# Patient Record
Sex: Female | Born: 1945 | Race: White | Hispanic: No
Health system: Midwestern US, Community
[De-identification: ages and names within clinical notes are randomized; demographics above are authoritative.]

## PROBLEM LIST (undated history)

## (undated) DIAGNOSIS — F410 Panic disorder [episodic paroxysmal anxiety] without agoraphobia: Secondary | ICD-10-CM

## (undated) DIAGNOSIS — Z720 Tobacco use: Secondary | ICD-10-CM

## (undated) DIAGNOSIS — Z1231 Encounter for screening mammogram for malignant neoplasm of breast: Secondary | ICD-10-CM

## (undated) DIAGNOSIS — K209 Esophagitis, unspecified without bleeding: Secondary | ICD-10-CM

## (undated) DIAGNOSIS — I2609 Other pulmonary embolism with acute cor pulmonale: Principal | ICD-10-CM

## (undated) DIAGNOSIS — R059 Cough, unspecified: Secondary | ICD-10-CM

## (undated) DIAGNOSIS — Z8262 Family history of osteoporosis: Secondary | ICD-10-CM

## (undated) DIAGNOSIS — R599 Enlarged lymph nodes, unspecified: Secondary | ICD-10-CM

## (undated) DIAGNOSIS — F411 Generalized anxiety disorder: Secondary | ICD-10-CM

## (undated) DIAGNOSIS — R591 Generalized enlarged lymph nodes: Secondary | ICD-10-CM

## (undated) DIAGNOSIS — I10 Essential (primary) hypertension: Secondary | ICD-10-CM

## (undated) DIAGNOSIS — E782 Mixed hyperlipidemia: Secondary | ICD-10-CM

---

## 2014-04-18 ENCOUNTER — Encounter

## 2014-04-18 ENCOUNTER — Inpatient Hospital Stay: Admit: 2014-04-18 | Primary: Family Medicine

## 2014-04-18 ENCOUNTER — Ambulatory Visit: Admit: 2014-04-18 | Primary: Family Medicine

## 2014-04-18 ENCOUNTER — Encounter: Admit: 2014-04-18 | Primary: Family Medicine

## 2014-04-18 DIAGNOSIS — Z8262 Family history of osteoporosis: Secondary | ICD-10-CM

## 2014-04-18 LAB — CBC WITH AUTO DIFFERENTIAL
Basophils %: 1.5 % — ABNORMAL HIGH (ref 0–1)
Basophils Absolute: 0.1 10*3/uL
Eosinophils %: 2.7 % (ref 0–3)
Eosinophils Absolute: 0.2 10*3/uL
Hematocrit: 46.4 % (ref 37–47)
Hemoglobin: 15.3 GM/DL (ref 12.5–16.0)
Lymphocytes %: 45.6 % — ABNORMAL HIGH (ref 24–44)
Lymphocytes Absolute: 2.9 10*3/uL
MCH: 31.5 PG — ABNORMAL HIGH (ref 27–31)
MCHC: 32.9 % (ref 32.0–36.0)
MCV: 95.7 FL (ref 78–100)
MPV: 9.3 FL (ref 7.5–11.1)
Monocytes %: 10 % — ABNORMAL HIGH (ref 0–4)
Monocytes Absolute: 0.6 10*3/uL
Platelets: 286 10*3/uL (ref 140–440)
RBC: 4.85 10*6/uL (ref 4.2–5.4)
RDW: 13.4 % (ref 11.7–14.9)
Segs Absolute: 2.6 10*3/uL
Segs Relative: 40.2 % (ref 36–66)
WBC: 6.4 10*3/uL (ref 4.0–10.5)

## 2014-04-18 LAB — LIPID PANEL
Cholesterol: 269 MG/DL — ABNORMAL HIGH (ref <200)
HDL: 55 MG/DL (ref >40)
LDL Direct: 201 MG/DL — ABNORMAL HIGH (ref <100)
Triglycerides: 104 MG/DL (ref <150)

## 2014-04-18 LAB — COMPREHENSIVE METABOLIC PANEL
ALT: 23 U/L (ref 10–40)
AST: 20 IU/L (ref 15–37)
Albumin: 4.3 GM/DL (ref 3.4–5.0)
Alkaline Phosphatase: 78 IU/L (ref 40–128)
Anion Gap: 12 (ref 4–16)
BUN: 15 MG/DL (ref 6–23)
CO2: 26 MMOL/L (ref 21–32)
Calcium: 9.5 MG/DL (ref 8.3–10.6)
Chloride: 96 mMol/L — ABNORMAL LOW (ref 99–110)
Creatinine: 0.8 MG/DL (ref 0.6–1.1)
GFR African American: >60 mL/min/{1.73_m2}
GFR Non-African American: >60 mL/min/{1.73_m2}
Glucose, Fasting: 100 MG/DL — ABNORMAL HIGH (ref 70–99)
Potassium: 4.5 MMOL/L (ref 3.5–5.1)
Sodium: 134 MMOL/L — ABNORMAL LOW (ref 135–145)
Total Bilirubin: 0.5 MG/DL (ref 0.0–1.0)
Total Protein: 7.2 GM/DL (ref 6.4–8.2)

## 2014-04-18 LAB — TSH: TSH, High Sensitivity: 0.09 u[IU]/mL — ABNORMAL LOW (ref 0.270–4.20)

## 2014-04-18 LAB — T4, FREE: T4 Free: 1.64 NG/DL (ref 0.9–1.8)

## 2015-02-24 ENCOUNTER — Encounter: Attending: Physical Medicine & Rehabilitation | Primary: Family Medicine

## 2015-02-28 ENCOUNTER — Inpatient Hospital Stay: Admit: 2015-02-28 | Discharge: 2015-02-28

## 2015-02-28 ENCOUNTER — Emergency Department: Admit: 2015-02-28 | Primary: Family Medicine

## 2015-02-28 DIAGNOSIS — S92352B Displaced fracture of fifth metatarsal bone, left foot, initial encounter for open fracture: Secondary | ICD-10-CM

## 2015-02-28 MED ORDER — ONDANSETRON 4 MG PO TBDP
4 MG | Freq: Once | ORAL | Status: DC
Start: 2015-02-28 — End: 2015-02-28

## 2015-02-28 MED ORDER — HYDROCODONE-ACETAMINOPHEN 5-325 MG PO TABS
5-325 MG | ORAL_TABLET | Freq: Four times a day (QID) | ORAL | 0 refills | Status: AC | PRN
Start: 2015-02-28 — End: 2015-03-07

## 2015-02-28 MED ORDER — IBUPROFEN 800 MG PO TABS
800 MG | Freq: Once | ORAL | Status: DC
Start: 2015-02-28 — End: 2015-02-28

## 2015-02-28 MED ORDER — ACETAMINOPHEN 500 MG PO TABS
500 MG | Freq: Once | ORAL | Status: DC
Start: 2015-02-28 — End: 2015-02-28

## 2015-02-28 MED FILL — ONDANSETRON 4 MG PO TBDP: 4 MG | ORAL | Qty: 1

## 2015-02-28 MED FILL — IBUPROFEN 800 MG PO TABS: 800 MG | ORAL | Qty: 1

## 2015-02-28 NOTE — ED Provider Notes (Signed)
eMERGENCY dEPARTMENT eNCOUnter        CHIEF COMPLAINT    Chief Complaint   Patient presents with   ??? Foot Injury     tripped and fell injuring R foot and ankle       HPI    Tiffany Little is a 69 y.o. female who presents with right ankle roll walking on a sidewalk about an hour. She denies any ankle pain but states that her foot hurts on the lateral side proximal middle. Onset was about 1300.  The duration has been since the onset.  The quality of the pain is sharp, rates at 6/10 with pressure, less painful at rest. No numbness or tingling to her foot.    REVIEW OF SYSTEMS    General: No fever  Neuro: see HPI    PAST MEDICAL & SURGICAL HISTORY    Past Medical History   Diagnosis Date   ??? Hyperlipidemia    ??? Hypertension    ??? Thyroid disease      History reviewed. No pertinent past surgical history.    CURRENT MEDICATIONS    Current Outpatient Rx   Medication Sig Dispense Refill   ??? HYDROcodone-acetaminophen (NORCO) 5-325 MG per tablet Take 1 tablet by mouth every 6 hours as needed for Pain . 10 tablet 0       ALLERGIES    Allergies   Allergen Reactions   ??? Morphine Nausea And Vomiting       SOCIAL & FAMILY HISTORY    Social History     Social History   ??? Marital status: Married     Spouse name: N/A   ??? Number of children: N/A   ??? Years of education: N/A     Social History Main Topics   ??? Smoking status: Former Smoker   ??? Smokeless tobacco: None   ??? Alcohol use No   ??? Drug use: No   ??? Sexual activity: Not Asked     Other Topics Concern   ??? None     Social History Narrative   ??? None     History reviewed. No pertinent family history.      PHYSICAL EXAM    VITAL SIGNS:   Visit Vitals   ??? BP (!) 149/68   ??? Pulse 65   ??? Temp 97.9 ??F (36.6 ??C) (Oral)   ??? Resp 15   ??? Ht 5\' 5"  (1.651 m)   ??? Wt 225 lb (102.1 kg)   ??? BMI 37.44 kg/m2     Constitutional:  Well developed, well nourished, no acute distress, non-toxic appearance   Vascular: 2+ DP pulses equal bil, < 2 sec cap refill BLE  Musculoskeletal:  She has FROM of her right  foot at ankle, + toe wiggle right, she has ttp over the base of her 5th metatarsal with associated dorsal moderate swelling and purple-grey bruising there  Integument:  Well hydrated, no rash   Neurologic:  Awake alert, no slurred speech, light touch sensation intact BLE  Psychiatric: Cooperative, pleasant affect    RADIOLOGY/PROCEDURES    X-ray right foot:   Impression: ??    ?? Acute nondisplaced transverse traumatic fracture of the base of the 5th  metatarsal.   ??   ?? Narrative: ??   ?? EXAMINATION:  3 VIEWS OF THE LEFT FOOT    02/28/2015 2:30 pm    COMPARISON:  None.    HISTORY:  ORDERING SYSTEM PROVIDED HISTORY: trauma; attention to fifth proximal  metatarsal  TECHNOLOGIST PROVIDED HISTORY:  Ordering Physician Provided Reason for Exam: pain  Acuity: Unknown  Type of Exam: Unknown  Relevant Medical/Surgical History: fall, pain mainly around 5th metacarpal    FINDINGS:  There is an acute nondisplaced transverse fracture of the base of the 5th  metatarsal. ??Mild to moderate degenerative changes of 1st MTP joint. ??No  other acute fracture dislocation. ??Joint spaces alignment otherwise  maintained. ??There is mild soft tissue swelling along the lateral aspect.  Calcaneal spurring. ??Soft tissues are otherwise unremarkable.        X-ray right ankle:    Impression: ??   ?? Fracture of the proximal 5th metatarsal will be better described on the foot  radiographs. ??No acute ankle abnormality   ??   ?? Narrative: ??   ?? EXAMINATION:  3 VIEWS OF THE LEFT ANKLE    02/28/2015 2:30 pm    COMPARISON:  None.    HISTORY:  ORDERING SYSTEM PROVIDED HISTORY: Fall, pain  TECHNOLOGIST PROVIDED HISTORY:  Ordering Physician Provided Reason for Exam: fall  Acuity: Unknown  Type of Exam: Unknown  Relevant Medical/Surgical History: fall, pain especially around 5th metatarsal    FINDINGS:  Anatomic alignment. ??There is a fracture of the proximal 5th metatarsal. ??No  other fractures are seen. ??Enthesopathy at the Achilles insertion and along  the  plantar surface of the calcaneus          ED COURSE & MEDICAL DECISION MAKING    Pertinent Labs & Imaging studies reviewed and interpreted. (See chart for details)    I independently eval this pt.    SPLINT PLACEMENT  A short leg splint was applied to the right foot and leg by the emergency department technician/RN.  I evaluated the splint after it was applied.  The splint is in good position.  The patient's extremity is neurovascularly intact after the splint placement.    I estimate there is LOW risk for COMPARTMENT SYNDROME, DEEP VENOUS THROMBOSIS, COMPLETE TENDON RUPTURE, OR NEUROVASCULAR INJURY, thus I consider the discharge disposition reasonable. Tiffany Little (or their surrogate) and I have discussed the diagnosis and risks, and we agree with discharging home with close follow-up. We also discussed returning to the Emergency Department immediately if new or worsening symptoms occur. We have discussed the symptoms which are most concerning that necessitate immediate return.      Vitals:    02/28/15 1401   BP: (!) 149/68   Pulse: 65   Resp: 15   Temp: 97.9 ??F (36.6 ??C)   TempSrc: Oral   Weight: 225 lb (102.1 kg)   Height:  (1.651 m)       CLINICAL IMPRESSION    Right proximal nondisplaced 5th metatarsal fracture    Plan  New Prescriptions    HYDROCODONE-ACETAMINOPHEN (NORCO) 5-325 MG PER TABLET    Take 1 tablet by mouth every 6 hours as needed for Pain .   Motrin/Tylenol q 6 hours prn pain  F/u with ortho in a week  D/c home      (Please note that this note was completed with a voice recognition program.  Every attempt was made to edit the dictations, but inevitably there remain words that are mis-transcribed.)       Tiffany Little, [place]  02/28/15 1513

## 2015-02-28 NOTE — ED Notes (Signed)
Xray complete

## 2015-02-28 NOTE — ED Provider Notes (Signed)
As physician-in-triage, I performed a medical screening history and physical exam on this patient.    HISTORY OF PRESENT ILLNESS  Tiffany Little is a 69 y.o. female twisted her left foot walking up the sidewalk about an hour ago.      PHYSICAL EXAM  There were no vitals taken for this visit.    On exam, the patient appears well-hydrated, well-nourished, and in no acute distress. Mucous membranes are moist. Speech is clear. Breathing is unlabored.  Skin is dry. Mental status is normal. Moves all extremities, and is without facial droop.     Comment: Please note this report has been produced using speech recognition software and may contain errors related to that system including errors in grammar, punctuation, and spelling, as well as words and phrases that may be inappropriate. If there are any questions or concerns please feel free to contact the dictating provider for clarification.     Mertha Findersohn Tysen Kameela Leipold, MD  02/28/15 1401

## 2015-02-28 NOTE — ED Triage Notes (Signed)
Pt tripped on sidewalk injuring R foot, and ankle.

## 2015-07-05 ENCOUNTER — Emergency Department: Admit: 2015-07-05 | Primary: Family Medicine

## 2015-07-05 ENCOUNTER — Emergency Department: Primary: Family Medicine

## 2015-07-05 ENCOUNTER — Encounter: Admit: 2015-07-05 | Primary: Family Medicine

## 2015-07-05 ENCOUNTER — Inpatient Hospital Stay
Admit: 2015-07-05 | Discharge: 2015-07-08 | Disposition: A | Discharge status: Skilled Nursing Facility | Source: Home / Self Care | Admitting: Internal Medicine

## 2015-07-05 ENCOUNTER — Inpatient Hospital Stay: Admit: 2015-07-05 | Primary: Family Medicine

## 2015-07-05 DIAGNOSIS — J930 Spontaneous tension pneumothorax: Secondary | ICD-10-CM

## 2015-07-05 LAB — CBC WITH AUTO DIFFERENTIAL
Basophils %: 1.4 % — ABNORMAL HIGH (ref 0–1)
Basophils Absolute: 0.1 10*3/uL
Eosinophils %: 3.3 % — ABNORMAL HIGH (ref 0–3)
Eosinophils Absolute: 0.3 10*3/uL
Hematocrit: 40.9 % (ref 37–47)
Hemoglobin: 13.5 GM/DL (ref 12.5–16.0)
Immature Neutrophil %: 0.3 % (ref 0–0.43)
Lymphocytes %: 39.8 % (ref 24–44)
Lymphocytes Absolute: 3.1 10*3/uL
MCH: 32 PG — ABNORMAL HIGH (ref 27–31)
MCHC: 33 % (ref 32.0–36.0)
MCV: 96.9 FL (ref 78–100)
MPV: 9.8 FL (ref 7.5–11.1)
Monocytes %: 9.9 % — ABNORMAL HIGH (ref 0–4)
Monocytes Absolute: 0.8 10*3/uL
Nucleated RBC %: 0 %
Platelets: 287 10*3/uL (ref 140–440)
RBC: 4.22 10*6/uL (ref 4.2–5.4)
RDW: 12.9 % (ref 11.7–14.9)
Segs Absolute: 3.5 10*3/uL
Segs Relative: 45.3 % (ref 36–66)
Total Immature Neutrophil: 0.02 10*3/uL
Total Nucleated RBC: 0 10*3/uL
WBC: 7.7 10*3/uL (ref 4.0–10.5)

## 2015-07-05 LAB — COMPREHENSIVE METABOLIC PANEL
ALT: 26 U/L (ref 10–40)
AST: 29 IU/L (ref 15–37)
Albumin: 3.7 GM/DL (ref 3.4–5.0)
Alkaline Phosphatase: 73 IU/L (ref 40–129)
Anion Gap: 15 (ref 4–16)
BUN: 14 MG/DL (ref 6–23)
CO2: 25 MMOL/L (ref 21–32)
Calcium: 8.7 MG/DL (ref 8.3–10.6)
Chloride: 95 mMol/L — ABNORMAL LOW (ref 99–110)
Creatinine: 0.7 MG/DL (ref 0.6–1.1)
GFR African American: >60 mL/min/{1.73_m2} (ref >60)
GFR Non-African American: >60 mL/min/{1.73_m2} (ref >60)
Glucose: 147 MG/DL — ABNORMAL HIGH (ref 70–140)
Potassium: 3.2 MMOL/L — ABNORMAL LOW (ref 3.5–5.1)
Sodium: 135 MMOL/L (ref 135–145)
Total Bilirubin: 0.5 MG/DL (ref 0.0–1.0)
Total Protein: 6.8 GM/DL (ref 6.4–8.2)

## 2015-07-05 LAB — APTT: aPTT: 26.5 SECONDS (ref 21.2–33.0)

## 2015-07-05 LAB — BRAIN NATRIURETIC PEPTIDE: Pro-BNP: 29.8 PG/ML (ref <300)

## 2015-07-05 LAB — PROTIME-INR
INR: 0.98 INDEX
Protime: 11.2 SECONDS

## 2015-07-05 LAB — TROPONIN
Troponin T: <0.01 NG/ML (ref <0.01)
Troponin T: <0.01 NG/ML (ref <0.01)

## 2015-07-05 MED ORDER — NORMAL SALINE FLUSH 0.9 % IV SOLN
0.9 % | Freq: Once | INTRAVENOUS | Status: AC | PRN
Start: 2015-07-05 — End: 2015-07-05
  Administered 2015-07-05: 18:00:00 10 mL via INTRAVENOUS

## 2015-07-05 MED ORDER — NORMAL SALINE FLUSH 0.9 % IV SOLN
0.9 % | INTRAVENOUS | Status: DC | PRN
Start: 2015-07-05 — End: 2015-07-06

## 2015-07-05 MED ORDER — HYDROCODONE-ACETAMINOPHEN 5-325 MG PO TABS
5-325 MG | Freq: Once | ORAL | Status: AC
Start: 2015-07-05 — End: 2015-07-05
  Administered 2015-07-05: 16:00:00 2 via ORAL

## 2015-07-05 MED ORDER — ACETAMINOPHEN 325 MG PO TABS
325 MG | ORAL | Status: DC | PRN
Start: 2015-07-05 — End: 2015-07-05

## 2015-07-05 MED ORDER — LEVOTHYROXINE SODIUM 100 MCG PO TABS
100 MCG | Freq: Every day | ORAL | Status: DC
Start: 2015-07-05 — End: 2015-07-06

## 2015-07-05 MED ORDER — HYDROMORPHONE HCL 1 MG/ML IJ SOLN
1 MG/ML | INTRAMUSCULAR | Status: DC | PRN
Start: 2015-07-05 — End: 2015-07-06
  Administered 2015-07-06 (×2): 0.5 mg via INTRAVENOUS

## 2015-07-05 MED ORDER — ENOXAPARIN SODIUM 40 MG/0.4ML SC SOLN
40 MG/0.4ML | Freq: Every day | SUBCUTANEOUS | Status: DC
Start: 2015-07-05 — End: 2015-07-06

## 2015-07-05 MED ORDER — MORPHINE SULFATE (PF) 2 MG/ML IV SOLN
2 MG/ML | INTRAVENOUS | Status: DC | PRN
Start: 2015-07-05 — End: 2015-07-05

## 2015-07-05 MED ORDER — NORMAL SALINE FLUSH 0.9 % IV SOLN
0.9 | INTRAVENOUS | Status: AC
Start: 2015-07-05 — End: 2015-07-05

## 2015-07-05 MED ORDER — PANTOPRAZOLE SODIUM 40 MG PO TBEC
40 MG | Freq: Every day | ORAL | Status: DC
Start: 2015-07-05 — End: 2015-07-06

## 2015-07-05 MED ORDER — POTASSIUM CHLORIDE 10 MEQ/100ML IV SOLN
10 MEQ/0ML | INTRAVENOUS | Status: DC | PRN
Start: 2015-07-05 — End: 2015-07-06

## 2015-07-05 MED ORDER — NORMAL SALINE FLUSH 0.9 % IV SOLN
0.9 % | Freq: Two times a day (BID) | INTRAVENOUS | Status: DC
Start: 2015-07-05 — End: 2015-07-06
  Administered 2015-07-06 (×2): 10 mL via INTRAVENOUS

## 2015-07-05 MED ORDER — POTASSIUM CHLORIDE CRYS ER 20 MEQ PO TBCR
20 MEQ | ORAL | Status: DC | PRN
Start: 2015-07-05 — End: 2015-07-06

## 2015-07-05 MED ORDER — TRIAMTERENE-HCTZ 37.5-25 MG PO TABS
Freq: Every morning | ORAL | Status: DC
Start: 2015-07-05 — End: 2015-07-06

## 2015-07-05 MED ORDER — OXYCODONE-ACETAMINOPHEN 5-325 MG PO TABS
5-325 MG | ORAL | Status: DC | PRN
Start: 2015-07-05 — End: 2015-07-06
  Administered 2015-07-06: 04:00:00 1 via ORAL

## 2015-07-05 MED ORDER — IOVERSOL 68 % IV SOLN
68 | INTRAVENOUS | Status: AC
Start: 2015-07-05 — End: 2015-07-05

## 2015-07-05 MED ORDER — LIDOCAINE-EPINEPHRINE 1 %-1:100000 IJ SOLN
1 %-:00000 | Freq: Once | INTRAMUSCULAR | Status: AC
Start: 2015-07-05 — End: 2015-07-05
  Administered 2015-07-05: 17:00:00 20 mL via INTRADERMAL

## 2015-07-05 MED ORDER — ATENOLOL 25 MG PO TABS
25 MG | Freq: Every day | ORAL | Status: DC
Start: 2015-07-05 — End: 2015-07-06

## 2015-07-05 MED ORDER — FENTANYL CITRATE (PF) 100 MCG/2ML IJ SOLN
100 MCG/2ML | Freq: Once | INTRAMUSCULAR | Status: DC
Start: 2015-07-05 — End: 2015-07-05

## 2015-07-05 MED ORDER — IOVERSOL 68 % IV SOLN
68 % | Freq: Once | INTRAVENOUS | Status: AC | PRN
Start: 2015-07-05 — End: 2015-07-05
  Administered 2015-07-05: 18:00:00 100 mL via INTRAVENOUS

## 2015-07-05 MED ORDER — MIDAZOLAM HCL 2 MG/2ML IJ SOLN
2 MG/ML | Freq: Once | INTRAMUSCULAR | Status: AC
Start: 2015-07-05 — End: 2015-07-05
  Administered 2015-07-05: 17:00:00 2 mg via INTRAVENOUS

## 2015-07-05 MED ORDER — DOCUSATE SODIUM 100 MG PO CAPS
100 MG | Freq: Two times a day (BID) | ORAL | Status: DC
Start: 2015-07-05 — End: 2015-07-06
  Administered 2015-07-06: 01:00:00 100 mg via ORAL

## 2015-07-05 MED ORDER — HYDROCODONE-ACETAMINOPHEN 5-325 MG PO TABS
5-325 MG | Freq: Once | ORAL | Status: AC
Start: 2015-07-05 — End: 2015-07-05
  Administered 2015-07-05: 17:00:00 1 via ORAL

## 2015-07-05 MED ORDER — VERAPAMIL HCL 80 MG PO TABS
80 MG | Freq: Three times a day (TID) | ORAL | Status: DC
Start: 2015-07-05 — End: 2015-07-06
  Administered 2015-07-06: 01:00:00 80 mg via ORAL

## 2015-07-05 MED ORDER — POTASSIUM CHLORIDE 20 MEQ/15ML (10%) PO SOLN
20 MEQ/15ML (10%) | ORAL | Status: DC | PRN
Start: 2015-07-05 — End: 2015-07-06

## 2015-07-05 MED ORDER — SIMVASTATIN 20 MG PO TABS
20 MG | Freq: Every evening | ORAL | Status: DC
Start: 2015-07-05 — End: 2015-07-06
  Administered 2015-07-06: 01:00:00 20 mg via ORAL

## 2015-07-05 MED ORDER — MAGNESIUM SULFATE IN D5W 1-5 GM/100ML-% IV SOLN
1-5 GM/100ML-% | INTRAVENOUS | Status: DC | PRN
Start: 2015-07-05 — End: 2015-07-06

## 2015-07-05 MED ORDER — MIDAZOLAM HCL 2 MG/2ML IJ SOLN
2 MG/ML | Freq: Once | INTRAMUSCULAR | Status: AC | PRN
Start: 2015-07-05 — End: 2015-07-05
  Administered 2015-07-05: 16:00:00 2 mg via INTRAVENOUS

## 2015-07-05 MED ORDER — ONDANSETRON HCL 4 MG/2ML IJ SOLN
4 MG/2ML | Freq: Four times a day (QID) | INTRAMUSCULAR | Status: DC | PRN
Start: 2015-07-05 — End: 2015-07-05

## 2015-07-05 MED ORDER — NITROGLYCERIN 0.4 MG SL SUBL
0.4 MG | SUBLINGUAL | Status: AC | PRN
Start: 2015-07-05 — End: 2015-07-05
  Administered 2015-07-05 (×3): 0.4 mg via SUBLINGUAL

## 2015-07-05 MED ORDER — SODIUM CHLORIDE 0.9 % IV SOLN
0.9 % | INTRAVENOUS | Status: DC
Start: 2015-07-05 — End: 2015-07-06

## 2015-07-05 MED ORDER — ACETAMINOPHEN 325 MG PO TABS
325 MG | ORAL | Status: DC | PRN
Start: 2015-07-05 — End: 2015-07-06

## 2015-07-05 MED ORDER — SODIUM CHLORIDE 0.9 % IV SOLN
0.9 % | INTRAVENOUS | Status: DC
Start: 2015-07-05 — End: 2015-07-06
  Administered 2015-07-05 – 2015-07-06 (×2): via INTRAVENOUS

## 2015-07-05 MED ORDER — MAGNESIUM HYDROXIDE 400 MG/5ML PO SUSP
400 MG/5ML | Freq: Every day | ORAL | Status: DC | PRN
Start: 2015-07-05 — End: 2015-07-05

## 2015-07-05 MED ORDER — ONDANSETRON HCL 4 MG/2ML IJ SOLN
4 MG/2ML | Freq: Four times a day (QID) | INTRAMUSCULAR | Status: DC | PRN
Start: 2015-07-05 — End: 2015-07-06
  Administered 2015-07-06: 02:00:00 4 mg via INTRAVENOUS

## 2015-07-05 MED ORDER — MAGNESIUM HYDROXIDE 400 MG/5ML PO SUSP
400 MG/5ML | Freq: Every day | ORAL | Status: DC | PRN
Start: 2015-07-05 — End: 2015-07-06

## 2015-07-05 MED ORDER — KETOROLAC TROMETHAMINE 30 MG/ML IJ SOLN
30 MG/ML | Freq: Four times a day (QID) | INTRAMUSCULAR | Status: DC | PRN
Start: 2015-07-05 — End: 2015-07-05

## 2015-07-05 MED FILL — NORMAL SALINE FLUSH 0.9 % IV SOLN: 0.9 % | INTRAVENOUS | Qty: 20

## 2015-07-05 MED FILL — SODIUM CHLORIDE 0.9 % IV SOLN: 0.9 % | INTRAVENOUS | Qty: 1000

## 2015-07-05 MED FILL — HYDROCODONE-ACETAMINOPHEN 5-325 MG PO TABS: 5-325 MG | ORAL | Qty: 1

## 2015-07-05 MED FILL — OPTIRAY 320 68 % IV SOLN: 68 % | INTRAVENOUS | Qty: 100

## 2015-07-05 MED FILL — HYDROCODONE-ACETAMINOPHEN 5-325 MG PO TABS: 5-325 MG | ORAL | Qty: 2

## 2015-07-05 MED FILL — LIDOCAINE-EPINEPHRINE 1 %-1:100000 IJ SOLN: 1 %-:00000 | INTRAMUSCULAR | Qty: 20

## 2015-07-05 MED FILL — VERAPAMIL HCL 80 MG PO TABS: 80 MG | ORAL | Qty: 1

## 2015-07-05 MED FILL — MIDAZOLAM HCL 2 MG/2ML IJ SOLN: 2 MG/ML | INTRAMUSCULAR | Qty: 2

## 2015-07-05 MED FILL — NITROSTAT 0.4 MG SL SUBL: 0.4 MG | SUBLINGUAL | Qty: 25

## 2015-07-05 NOTE — Progress Notes (Signed)
Dr. Aquilla Solian returned call, informed of consult and patient's condition.  Advised to have a bedside: line cart, 6-61/2 sterile gloves, mask, gown, 10/12 french chest tube and chest tube kit.  Will be up to see patient.  Maggie Schwalbe, RN

## 2015-07-05 NOTE — Progress Notes (Signed)
No dressing noted on chest tube.  Medicated vaseline guaze applied with 3 split guaze and covered with 5 4x4's.  occlusively taped with 2 inch silk tape.  Stoney BangKim Flax, RN Team Leader notified and at bedside.  Patient tol well.  Assisted into bed from sitting on side of bed.  Right chest tube remains at -20 cm suction with no fluctuation in water chamber.  Air leak noted.  Patient complains of shortness of breath. Resp even at 20.  SaO2 98% on 2 lpm by nasal cannula.  Stat PCXR ordered.  Rodell PernaPamela J Vanice Rappa, RN

## 2015-07-05 NOTE — ED Notes (Signed)
Patient back from CT.      Ross MarcusAmber McDevitt, RN  07/05/15 [phone]

## 2015-07-05 NOTE — Progress Notes (Signed)
Dr. Ward GivensKhouzam paged by dialing [phone] and speaking with bureau and sending a page.  Rodell PernaPamela J Jeovanny Cuadros, RN

## 2015-07-05 NOTE — ED Notes (Signed)
Patient is going upstairs to room 2015 at this time.     Ross MarcusAmber McDevitt, RN  07/05/15 [phone]

## 2015-07-05 NOTE — ED Provider Notes (Signed)
eMERGENCY dEPARTMENT eNCOUnter      PCP: Aggie Moats, MD    CHIEF COMPLAINT    Chief Complaint   Patient presents with   ??? Chest Pain   ??? Shortness of Breath       HPI    Tiffany Little is a 70 y.o. female who presents to the emergency department today with a chief complaint of right-sided chest pain and shortness of breath.  Onset was approximately one hour prior to arrival.  Patient states that she was doing yard work when she began experiencing this pressure and chest pain to the right anterior chest wall area.  Denies recent trauma.  Patient stated the pain radiates from the right chest wall up into the right neck area.  Has been mildly short of breath.  Has had a mild cough over the last several days.  Patient presented to the emergency department today with hypoxia.  She was 87% initially, she was put on 2 L and got into the 90s.  She has not been seen by cardiology in several years.  Has low risk factors.  The family history of DVT.  No exogenous estrogen, no recent leg swelling.  No hemoptysis.  Does have pain with deep inspiration.    REVIEW OF SYSTEMS    Constitutional:  Denies fever, chills, weight loss or weakness   HENT:  Denies sore throat or ear pain   Cardiovascular: + chest pain.  No palpitations, No syncope  Respiratory:  See HPI.   GI:  Denies abdominal pain, nausea, vomiting, or diarrhea  GU:  Denies any urinary symptoms or vaginal symptoms.   Musculoskeletal:  Denies back pain,   Skin:  Denies rash  Neurologic:  Denies headache, focal weakness or sensory changes   Endocrine:  Denies polyuria or polydypsia   Lymphatic:  Denies swollen glands     All other review of systems are negative  See HPI and nursing notes for additional information       PAST MEDICAL & SURGICAL HISTORY    Past Medical History:   Diagnosis Date   ??? Hyperlipidemia    ??? Hypertension    ??? Thyroid disease      History reviewed. No pertinent surgical history.    CURRENT MEDICATIONS    Current Outpatient Rx   Medication Sig  Dispense Refill   ??? verapamil (CALAN) 80 MG tablet Take 80 mg by mouth 3 times daily     ??? DULoxetine (CYMBALTA) 30 MG extended release capsule Take 30 mg by mouth daily     ??? simvastatin (ZOCOR) 40 MG tablet Take 40 mg by mouth nightly     ??? atenolol (TENORMIN) 25 MG tablet Take 25 mg by mouth daily         ALLERGIES    Allergies   Allergen Reactions   ??? Morphine Nausea And Vomiting       SOCIAL & FAMILY HISTORY    Social History     Social History   ??? Marital status: Married     Spouse name: N/A   ??? Number of children: N/A   ??? Years of education: N/A     Social History Main Topics   ??? Smoking status: Former Smoker   ??? Smokeless tobacco: None   ??? Alcohol use No   ??? Drug use: No   ??? Sexual activity: Not Asked     Other Topics Concern   ??? None     Social History Narrative  History reviewed. No pertinent family history.    PHYSICAL EXAM    VITAL SIGNS: BP 128/78   Pulse 69   Resp 16   SpO2 96%   Constitutional:  Well developed, well nourished, no acute distress   HENT:  Atraumatic, moist mucus membranes  Neck/Lymphatics: supple, no JVD, no swollen nodes  Respiratory:  Nonlabored breathing.  Lung sounds are diminished bilaterally.  No obvious rhonchi or rales.  No adventitious sounds.  No accessory muscle use.  Patient is talking in full sentences.   Cardiovascular:  regular rate, no murmurs  GI:  Soft, nontender, normal bowel sounds  Musculoskeletal:   No edema, no acute deformities  Integument:  Skin is warm and dry, no petechiae   Neurologic:  Alert & oriented, no slurred speech  Psych: Pleasant affect, no hallucinations    EKG    See supervising physician's note for EKG interpretation    LABS:  Results for orders placed or performed during the hospital encounter of 07/05/15   CBC auto diff   Result Value Ref Range    WBC 7.7 4.0 - 10.5 K/CU MM    RBC 4.22 4.2 - 5.4 M/CU MM    Hemoglobin 13.5 12.5 - 16.0 GM/DL    Hematocrit 16.1 37 - 47 %    MCV 96.9 78 - 100 FL    MCH 32.0 (H) 27 - 31 PG    MCHC 33.0 32.0 - 36.0  %    RDW 12.9 11.7 - 14.9 %    Platelets 287 140 - 440 K/CU MM    MPV 9.8 7.5 - 11.1 FL    Differential Type AUTOMATED DIFFERENTIAL     Segs Relative 45.3 36 - 66 %    Lymphocytes % 39.8 24 - 44 %    Monocytes % 9.9 (H) 0 - 4 %    Eosinophils % 3.3 (H) 0 - 3 %    Basophils % 1.4 (H) 0 - 1 %    Segs Absolute 3.5 K/CU MM    Lymphocytes # 3.1 K/CU MM    Monocytes # 0.8 K/CU MM    Eosinophils # 0.3 K/CU MM    Basophils # 0.1 K/CU MM    Nucleated RBC % 0.0 %    Total Nucleated RBC 0.0 K/CU MM    Total Immature Neutrophil 0.02 K/CU MM    Immature Neutrophil % 0.3 0 - 0.43 %   PT - INR   Result Value Ref Range    Protime 11.2 SECONDS    INR 0.98 INDEX   PTT   Result Value Ref Range    aPTT 26.5 21.2 - 33.0 SECONDS   CMP   Result Value Ref Range    Sodium 135 135 - 145 MMOL/L    Potassium 3.2 (L) 3.5 - 5.1 MMOL/L    Chloride 95 (L) 99 - 110 mMol/L    CO2 25 21 - 32 MMOL/L    BUN 14 6 - 23 MG/DL    CREATININE 0.7 0.6 - 1.1 MG/DL    Glucose 096 (H) 70 - 140 MG/DL    Calcium 8.7 8.3 - 04.5 MG/DL    Alb 3.7 3.4 - 5.0 GM/DL    Total Protein 6.8 6.4 - 8.2 GM/DL    Total Bilirubin 0.5 0.0 - 1.0 MG/DL    ALT 26 10 - 40 U/L    AST 29 15 - 37 IU/L    Alkaline Phosphatase 73 40 - 129 IU/L  GFR Non-African American >60 >60 mL/min/1.6573m2    GFR African American >60 >60 mL/min/1.6073m2    Anion Gap 15 4 - 16   Troponin   Result Value Ref Range    Troponin T <0.010 <0.01 NG/ML   Brain Natriuretic Peptide   Result Value Ref Range    Pro-BNP 29.80 <300 PG/ML   EKG 12 Lead   Result Value Ref Range    Ventricular Rate 75 BPM    Atrial Rate 75 BPM    P-R Interval 140 ms    QRS Duration 80 ms    Q-T Interval 420 ms    QTc Calculation (Bazett) 469 ms    P Axis 0 degrees    R Axis -9 degrees    T Axis 29 degrees    Diagnosis       Normal sinus rhythm  Normal ECG  No previous ECGs available       RADIOLOGY/PROCEDURES    ?? XR Chest Portable (Final result) Result time: 07/05/15 14:21:08   ?? Final result by Marcy SalvoMatthew M Robbins, MD (07/05/15 14:21:08)    ?? Impression: ??   ?? Interval right chest tube placement with partial re-expansion of the right  lung. ??Small -moderate right pneumothorax remains.    Side hole of the chest tube projects just beyond the lateral margin of the  chest. ??Consider slightly advancing.   ??   ?? Narrative: ??   ?? EXAMINATION:  SINGLE VIEW OF THE CHEST    07/05/2015 1:36 pm    COMPARISON:  Jul 05, 2015    HISTORY:  ORDERING SYSTEM PROVIDED HISTORY: check tube placement  TECHNOLOGIST PROVIDED HISTORY:  Ordering Physician Provided Reason for Exam: tube placement  Acuity: Unknown  Type of Encounter: Unknown  Additional signs and symptoms: ct placement    FINDINGS:  Right chest tube has been placed. ??Side hole projects just beyond the ribs.  Right lung has been partially re-expanded. ??Small-moderate right pneumothorax  remains.    Left lung is clear.    Mild cardiomegaly. ??No pulmonary edema.   ??   ??    ??    ?? CTA Pulmonary With Contrast (In process)    ??    ?? XR Chest Portable (Final result) Result time: 07/05/15 13:07:24   ?? Final result by Para MarchAndrew C Neckers, MD (07/05/15 13:07:24)   ?? Impression: ??   ?? Moderate to large right pneumothorax with evidence for tension.    Critical results were called by Dr. Doy HutchingAndrew C. Neckers, MD to Joash Tony S  Bobak Oguinn on 07/05/2015 at 12:03.   ??   ?? Narrative: ??   ?? EXAMINATION:  SINGLE VIEW OF THE CHEST    07/05/2015 11:52 am    COMPARISON:  None    HISTORY:  ORDERING SYSTEM PROVIDED HISTORY: Chest pain  TECHNOLOGIST PROVIDED HISTORY:  Ordering Physician Provided Reason for Exam: Chest pain  Acuity: Acute  Type of Encounter: Initial  Additional signs and symptoms: Patient presents to ER today for chest pain  and sob. Patient reports that she was doing work around the house and noticed  some pressure in her mid chest    FINDINGS:  There is a moderate to large right pneumothorax. ??There is leftward  mediastinal shift. ??Airspace opacities to portions of right lung likely  relate to partial collapse relating to the  pneumothorax. ??Left lung appears  clear. ??Cardiac and mediastinal silhouettes are within normal limits. ??No  acute bony abnormalities.  ED COURSE & MEDICAL DECISION MAKING       Vital signs and nursing notes reviewed during ED course.  Patient care and presentation staffed with supervising MD.  Patient seen by supervising MD today- see his/her note for details of the encounter.    All pertinent Lab data and radiographic results reviewed with patient at bedside.  The patient and/or the family were informed of the results of any tests/labs/imaging, the treatment plan, and time was allotted to answer questions.       Vitals:    07/05/15 1034 07/05/15 1232   BP:  128/78   Pulse:  69   Resp:  16   TempSrc: Oral    SpO2:  96%       Differential Diagnosis: Acute Coronary Syndrome, Congestive Heart Failure, Myocardial Infarction, Pulmonary Embolus, Pneumonia, Pneumothorax, other    Clinical  IMPRESSION    1. Tension pneumothorax, spontaneous      Patient presented above.  Patient had right-sided chest pain that radiated up into her neck area with associated shortness of breath and hour prior to arrival.  She was initially hypoxic in the upper 80s, we placed her on oxygen she was up in the mid 90s.  Her vital signs are stable.  She was talking in full sentences.  There is no signs of obvious respiratory distress.  Lung sounds are mis-bilaterally.  EKG demonstrated no signs of ACS.  Chest x-ray did show a moderate to large right pneumothorax with evidence for tension.  Patient was moved into trauma bay.  With Dr. Remigio Eisenmenger assistance we performed a chest tube to the right chest area at the fourth intercostal space.  Patient was given Versed.  There is mild difficulty inserting the chest tube secondary to body habitus.  Tube was eventually placed with a gush of air, lung sounds were equal bilaterally.  Repeat chest x-ray did show interval right chest tube placement with partial reexpansion of the right lung.  There is still  a small right pneumothorax remains.  Side hole the chest tube projects just beyond the lateral margin of the chest, radiology considered slightly advancing.  Went back in the room and slightly advanced the chest tube.  Troponin was negative.  We have a CTA pending.  They called the hospitalist for admission.  They will admit the patient. Patient placed in ICU.  This patient was staffed with Dr. Danae Orleans.  Dr. Selmer Dominion the cardiothoracic surgeon was notified about the patient. See Dr. Remigio Eisenmenger note for further details.    Comment: Please note this report has been produced using speech recognition software and may contain errors related to that system including errors in grammar, punctuation, and spelling, as well as words and phrases that may be inappropriate. If there are any questions or concerns please feel free to contact the dictating provider for clarification.                       Anastasio Auerbach Jae Bruck, PA  07/05/15 1457

## 2015-07-05 NOTE — ED Notes (Signed)
Patient to CT.     Ross MarcusAmber McDevitt, RN  07/05/15 1352

## 2015-07-05 NOTE — Progress Notes (Signed)
Dr. Milta DeitersS. Khan returned call, update on patient's condition given.  No new orders.  Rodell PernaPamela J Harvy Riera, RN

## 2015-07-05 NOTE — ED Notes (Signed)
ICU SD charge nurse Selena BattenKim called stating that they cannot take the patient until the order is in for the patient to go to room 2015. This nurse contacted my charge nurse, Durwin RegesJackie RN, and she is working on it.     Ross MarcusAmber McDevitt, RN  07/05/15 1536

## 2015-07-05 NOTE — ED Notes (Signed)
[facility]     Ross MarcusAmber McDevitt, RN  07/05/15 [phone]

## 2015-07-05 NOTE — ED Notes (Signed)
Matt PA in talking with patient about plan of care.      Ross MarcusAmber McDevitt, RN  07/05/15 [phone]

## 2015-07-05 NOTE — ED Provider Notes (Signed)
APP Supervisory Note    This patient was evaluated in the ED for chest pain, SOB, and hypoxia, although initial H&PE information was obtained by Christiane HaMatthew Brumfield, PA, who also dictated a record of this visit.  I independently examined and evaluated this patient and made all diagnostic, treatment, and disposition decisions.      In brief, Tiffany Little is a 70 y.o. female with a history of HTN, thyroid disease, HLD that presents with right sided chest pain radiating to the back about an hour prior to arrival.  She was doing yardwork.  Had lightheadedness and SOB with associated nausea.  Last stress test in the 80s.  Former smoker.  No history of DVT or risk factors of DVT.  The patient was hypoxic in the 80s on arrival, now on 4L in the 90s.    ED Triage Vitals   Enc Vitals Group      BP --       Pulse --       Resp --       Temp --       Temp Source 07/05/15 1034 Oral      SpO2 --       Weight --       Height --       Head Cir --       Peak Flow --       Pain Score --       Pain Loc --       Pain Edu? --       Excl. in GC? --      BP 128/77   Pulse 88   Temp 98.1 ??F (36.7 ??C) (Oral)    Resp 21   SpO2 97%  Focused physical examination revealed Heart RRR, lungs clear bilaterally - difficult exam 2/2 habitus, abdomen S/NT/ND, no leg swelling    Labs, Imaging, and EKG findings:  Results for orders placed or performed during the hospital encounter of 07/05/15   CBC auto diff   Result Value Ref Range    WBC 7.7 4.0 - 10.5 K/CU MM    RBC 4.22 4.2 - 5.4 M/CU MM    Hemoglobin 13.5 12.5 - 16.0 GM/DL    Hematocrit 16.140.9 37 - 47 %    MCV 96.9 78 - 100 FL    MCH 32.0 (H) 27 - 31 PG    MCHC 33.0 32.0 - 36.0 %    RDW 12.9 11.7 - 14.9 %    Platelets 287 140 - 440 K/CU MM    MPV 9.8 7.5 - 11.1 FL    Differential Type AUTOMATED DIFFERENTIAL     Segs Relative 45.3 36 - 66 %    Lymphocytes % 39.8 24 - 44 %    Monocytes % 9.9 (H) 0 - 4 %    Eosinophils % 3.3 (H) 0 - 3 %    Basophils % 1.4 (H) 0 - 1 %    Segs Absolute 3.5 K/CU MM     Lymphocytes # 3.1 K/CU MM    Monocytes # 0.8 K/CU MM    Eosinophils # 0.3 K/CU MM    Basophils # 0.1 K/CU MM    Nucleated RBC % 0.0 %    Total Nucleated RBC 0.0 K/CU MM    Total Immature Neutrophil 0.02 K/CU MM    Immature Neutrophil % 0.3 0 - 0.43 %   PT - INR   Result Value Ref Range  Protime 11.2 SECONDS    INR 0.98 INDEX   PTT   Result Value Ref Range    aPTT 26.5 21.2 - 33.0 SECONDS   CMP   Result Value Ref Range    Sodium 135 135 - 145 MMOL/L    Potassium 3.2 (L) 3.5 - 5.1 MMOL/L    Chloride 95 (L) 99 - 110 mMol/L    CO2 25 21 - 32 MMOL/L    BUN 14 6 - 23 MG/DL    CREATININE 0.7 0.6 - 1.1 MG/DL    Glucose 161 (H) 70 - 140 MG/DL    Calcium 8.7 8.3 - 09.6 MG/DL    Alb 3.7 3.4 - 5.0 GM/DL    Total Protein 6.8 6.4 - 8.2 GM/DL    Total Bilirubin 0.5 0.0 - 1.0 MG/DL    ALT 26 10 - 40 U/L    AST 29 15 - 37 IU/L    Alkaline Phosphatase 73 40 - 129 IU/L    GFR Non-African American >60 >60 mL/min/1.53m2    GFR African American >60 >60 mL/min/1.76m2    Anion Gap 15 4 - 16   Troponin   Result Value Ref Range    Troponin T <0.010 <0.01 NG/ML   Brain Natriuretic Peptide   Result Value Ref Range    Pro-BNP 29.80 <300 PG/ML   Troponin   Result Value Ref Range    Troponin T <0.010 <0.01 NG/ML   EKG 12 Lead   Result Value Ref Range    Ventricular Rate 75 BPM    Atrial Rate 75 BPM    P-R Interval 140 ms    QRS Duration 80 ms    Q-T Interval 420 ms    QTc Calculation (Bazett) 469 ms    P Axis 0 degrees    R Axis -9 degrees    T Axis 29 degrees    Diagnosis       Normal sinus rhythm  Normal ECG  No previous ECGs available  Confirmed by Nkadi MD, Chukwuemeke ([id number]) on 07/05/2015 4:37:29 PM       XR Chest Portable   Final Result   Decrease in the size of the right pneumothorax.  Otherwise, stable chest         CTA Pulmonary With Contrast   Preliminary Result   1. No pulmonary embolus.   2. Small to moderate right pneumothorax with right chest tube in place.   Extensive subcutaneous gas seen along the right lateral chest wall.    3. Bibasilar atelectasis.   4. Diffuse ground glass throughout the lungs is favored to represent   atelectasis over pulmonary edema.   5. Prominent right peritracheal and hilar lymph nodes, possibly reactive.   Recommend repeat chest CT with IV contrast in three months to evaluate for   resolution.         XR Chest Portable   Final Result   Interval right chest tube placement with partial re-expansion of the right   lung.  Small -moderate right pneumothorax remains.      Side hole of the chest tube projects just beyond the lateral margin of the   chest.  Consider slightly advancing.         XR Chest Portable   Final Result   Moderate to large right pneumothorax with evidence for tension.      Critical results were called by Dr. Doy Hutching. Neckers, MD to MATTHEW S   BRUMFIELD on 07/05/2015 at 12:03.  XR Chest Portable    (Results Pending)   XR Chest Standard TWO VW    (Results Pending)      12 lead EKG per my interpretation:  Normal Sinus Rhythm 75bpm  Axis is   Left axis deviation  QTc is  Prolonged to 469  There is no specific T wave changes appreciated.  There is no specific ST wave changes appreciated.    Prior EKG to compare with was not available.        MDM:  70 y.o. female with a history of HTN, thyroid disease, HLD that presents with right sided chest pain radiating to the back about an hour prior to arrival.  Concerns for ACS, arrhythmia, PE, bronchitis, pneumonia, musculoskeletal pain.  Labs, imaging, and EKG as above.  CBC unremarkable.  Coags WNL.  CMP with mild hypokalemia.  Troponin <0.010.  BNP 29.  CxR with a moderate to large right pneumothorax with evidence for torsion.  This patient had a right-sided chest tube placed in the emergency department by me.  She was given Versed for anxiety in the emergency department.  Her chest tube was placed on suction, and she will need to be admitted for further medical management.      Chest Tube Placement Procedure Note    Indication:  pneumothorax    Consent: The patient was counseled regarding the procedure, its indications, risks, potential complications and alternatives, and any questions were answered. Consent was obtained to proceed.    Pre-Medication: midazolam (Versed) 2.0 mg intravenously    Procedure: The patient was placed in a semirecumbent position with the head of the bed at 30 degrees. The right side was prepped with chlorhexidine.  Local anesthesia over the insertion site was obtained by infiltration using 1% Lidocaine with epinephrine.  An incision was made laterally in the midaxillary line.  Blunt dissection up and over the rib was performed until access was obtained into the pleural cavity.  A 26 French chest tube was placed and connected to suctio.  Initial output from the tube was air. The tube was sutured in place and the site was covered with an occlusive dressing.  All connections were banded.  Breath sounds after the procedure were improved.  A chest x-ray was obtained to evaluate placement which showed tube placement that required repositioning which was subsequently performed.    The patient tolerated the procedure well.    Complications: None          Clinical Impression:  1. Tension pneumothorax, spontaneous    2. Essential hypertension    3. Mixed hyperlipidemia    4. Thyroid disease         For other details of my evaluation of this patient, please see Molli Hazard Brumfield's documentation.     Hyman Bible, MD      Please note that portions of this note may have been complete with a voice recognition program.  Efforts were made to edit the dictations, but occasional words are mis-transcribed.         Hyman Bible, MD  07/05/15 2149

## 2015-07-05 NOTE — ED Notes (Signed)
CT WAITING LAB RESULTS.

## 2015-07-05 NOTE — ED Notes (Signed)
Matt PA and Dr. Danae OrleansBush at bedside putting in chest tube at this time     Ross Marcusmber McDevitt, RN  07/05/15 1304

## 2015-07-05 NOTE — Progress Notes (Signed)
Dr. Milta DeitersS. Khan paged by dialing [phone] and leaving a verbal message.  Waiting for response.  Rodell PernaPamela J Alleyne Lac, RN

## 2015-07-05 NOTE — Progress Notes (Signed)
Dr. Ward GivensKhouzam returned call, informed of above and new consult.  Advised to consult cardio-thorasic surgery for the chest tube and tension pneumothorax.  Rodell PernaPamela J Daleysa Kristiansen, RN

## 2015-07-05 NOTE — ED Notes (Signed)
Patient reports that she took 324mg  of asa at home pta.     Ross MarcusAmber McDevitt, RN  07/05/15 1041

## 2015-07-05 NOTE — H&P (Signed)
History and Physical      Name:  Tiffany Little DOB/Age/Sex: 12-14-1945  (70 y.o. female)   MRN & CSN:  [id number] & [id number] Admission Date/Time: 07/05/2015 10:31 AM   Location:  [facility] PCP: Aggie Moats, MD       Hospital Day: 1        Admitting Physician: Dr. Milta Deiters, MD    Assessment and Plan:   -Patient case and plan discussed with supervising physician-    Tiffany Little is a 70 y.o.  female  who presents with Tension pneumothorax      ??? Tension pneumothorax-spontaneous. CTA: "No pulmonary embolus.Small to moderate right pneumothorax with right chest tube in place.  Extensive subcutaneous gas seen along the right lateral chest wall.Bibasilar atelectasis.Diffuse ground glass throughout the lungs is favored to represent  atelectasis over pulmonary edema.Prominent right peritracheal and hilar lymph nodes, possibly reactive."   Admit to ICU   Consult General surgery for chest tube management   Trend troponin   Repeat EKG in AM   Repeat CXR in AM    Pain control     Other Chronic conditions appear stable. Continue home medications as ordered.    ??? Hyperlipidemia-continue zocor   ??? Hypertension- continue home regimen of antihypertensives   ??? Thyroid disease- continue synthroid      Diet  Regular    DVT Prophylaxis [x]  Lovenox, []   Heparin, []  SCDs, []  Ambulation   GI Prophylaxis [x]  PPI,  []  H2 Blocker,  []  Carafate,  []  Diet/Tube Feeds   Code Status Full     Disposition > 2 MN. Patient plans to return home upon discharge   MDM []  Low, []  Moderate,[x]   High     History of Present Illness:     Chief Complaint: Chest Pain and Shortness of Breath      Tiffany Little is a 70 y.o.  female  who presents with sudden onset of CP/ SOB. Reports she was performing typical household activites with acute onset about an hour prior to presentation to ED. Symptoms are described as right sided chest pain/ pressure sensation that radiated to neck. Current pain is rated 8/10. While in ED the patient was found to have  tension pneumothorax that required emergent CT placement right sided posterior-lateral. Improved symptoms and oxygen saturation post intervention. Describes "electric shock"  sensation anterior left chest wall and inframammary skin folds during assessment resulting in patient almost dislodging chest tube. Denies trauma or symptoms just prior to symptoms. She does report elevated BP recently and HA.   Ten point ROS reviewed negative, unless as noted above    Objective:   No intake or output data in the 24 hours ending 07/05/15 1540     Vitals:   Vitals:    07/05/15 1327 07/05/15 1332 07/05/15 1409 07/05/15 1432   BP: (!) 106/54 (!) 105/59 124/66 132/76   Pulse: 73 81  71   Resp: 10 27 16 21    TempSrc:       SpO2: 100% 100% 98% 99%       Physical Exam: 07/05/15     GEN -Awake non toxic appearing female, sitting upright in bed , NAD. obese body habitus. Appears given age.  EYES -Pupils are equally round.  No scleral erythema, discharge, or conjunctivitis.  HENT -Mucous membranes are moist. Oral pharynx without exudates, no evidence of thrush.  NECK -Supple, no apparent thyromegaly or masses.  RESP -Clear to auscultation, no wheezes, rales or rhonchi.  Symmetric chest movement while on supplemental oxygen. R chest tube insertion site noted .  C/V -S1/S2 auscultated. Regular rate without appreciable murmurs, rubs, or gallops. No JVD or carotid bruits. Peripheral pulses equal bilaterally and palpable. trace peripheral edema.  GI -Abdomen is soft without significant tenderness, masses, or guarding. Bowel sounds are normoactive. Rectal exam deferred.   GU -No costovertebral angle tenderness. Foley catheter is not present.  LYMPH-No palpable cervical lymphadenopathy and no hepatosplenomegaly. No petechiae or ecchymoses.  MS -No gross joint deformities.  SKIN -Normal coloration, warm, dry.  NEURO-Cranial nerves appear grossly intact, normal speech, no lateralizing weakness.  PSYC-Awake, alert, oriented x 4.  Appropriate  affect.    Past Medical History:      Past Medical History:   Diagnosis Date   ??? Hyperlipidemia    ??? Hypertension    ??? Thyroid disease         has no past surgical history reported    Social History:    FAM HX: Assessed and non contributory       Soc HX:   Social History     Social History   ??? Marital status: Married     Spouse name: N/A   ??? Number of children: N/A   ??? Years of education: N/A     Social History Main Topics   ??? Smoking status: Former Smoker   ??? Smokeless tobacco: None   ??? Alcohol use No   ??? Drug use: No   ??? Sexual activity: Not Asked     Other Topics Concern   ??? None     Social History Narrative     TOBACCO:   reports that she has quit smoking. She does not have any smokeless tobacco history on file.  ETOH:   reports that she does not drink alcohol.  Drugs:  reports that she does not use illicit drugs.    Allergies:   Allergies   Allergen Reactions   ??? Morphine Nausea And Vomiting           Medications:   Medications:    Infusions:   PRN Meds:     Data:     Laboratory this visit:  Reviewed  CBC   Recent Labs      07/05/15   1113   WBC  7.7   HGB  13.5   HCT  40.9   PLT  287      RENAL  Recent Labs      07/05/15   1113   NA  135   K  3.2*   CL  95*   CO2  25   BUN  14   CREATININE  0.7     LFT'S  Recent Labs      07/05/15   1113   AST  29   ALT  26   BILITOT  0.5   ALKPHOS  73     COAG  Recent Labs      07/05/15   1113   INR  0.98     CARDIAC ENZYMES  No results for input(s): CKTOTAL, CKMB, CKMBINDEX, TROPONINI in the last 72 hours.      Radiology this visit:  Reviewed.    Xr Chest Portable    Result Date: 07/05/2015  EXAMINATION: SINGLE VIEW OF THE CHEST 07/05/2015 1:36 pm COMPARISON: Jul 05, 2015 HISTORY: ORDERING SYSTEM PROVIDED HISTORY: check tube placement TECHNOLOGIST PROVIDED HISTORY: Ordering Physician Provided Reason for Exam: tube placement Acuity: Unknown Type of Encounter:  Unknown Additional signs and symptoms: ct placement FINDINGS: Right chest tube has been placed.  Side hole projects just  beyond the ribs. Right lung has been partially re-expanded.  Small-moderate right pneumothorax remains. Left lung is clear. Mild cardiomegaly.  No pulmonary edema.     Interval right chest tube placement with partial re-expansion of the right lung.  Small -moderate right pneumothorax remains. Side hole of the chest tube projects just beyond the lateral margin of the chest.  Consider slightly advancing.     Xr Chest Portable    Result Date: 07/05/2015  EXAMINATION: SINGLE VIEW OF THE CHEST 07/05/2015 11:52 am COMPARISON: None HISTORY: ORDERING SYSTEM PROVIDED HISTORY: Chest pain TECHNOLOGIST PROVIDED HISTORY: Ordering Physician Provided Reason for Exam: Chest pain Acuity: Acute Type of Encounter: Initial Additional signs and symptoms: Patient presents to ER today for chest pain and sob. Patient reports that she was doing work around the house and noticed some pressure in her mid chest FINDINGS: There is a moderate to large right pneumothorax.  There is leftward mediastinal shift.  Airspace opacities to portions of right lung likely relate to partial collapse relating to the pneumothorax.  Left lung appears clear.  Cardiac and mediastinal silhouettes are within normal limits.  No acute bony abnormalities.     Moderate to large right pneumothorax with evidence for tension. Critical results were called by Dr. Doy Hutching. Neckers, MD to MATTHEW S BRUMFIELD on 07/05/2015 at 12:03.     Cta Pulmonary With Contrast    Result Date: 07/05/2015  EXAMINATION: CTA OF THE CHEST 07/05/2015 2:00 pm TECHNIQUE: CTA of the chest was performed after the administration of intravenous contrast.  Multiplanar reformatted images are provided for review.  MIP images are provided for review. Dose modulation, iterative reconstruction, and/or weight based adjustment of the mA/kV was utilized to reduce the radiation dose to as low as reasonably achievable. COMPARISON: Radiograph dated Jul 05, 2015 HISTORY: ORDERING SYSTEM PROVIDED HISTORY: right sided chest  pain with hypoxia TECHNOLOGIST PROVIDED HISTORY: Ordering Physician Provided Reason for Exam: right sided chest pain with hypoxia, pneumo, nki Acuity: Acute Type of Encounter: Initial Additional signs and symptoms: none Relevant Medical/Surgical History: optiray 320/ gfr>60/ chest tube just put in, in the er/ no surgery FINDINGS: Pulmonary Arteries: Pulmonary arteries are adequately opacified for evaluation.  No evidence of intraluminal filling defect to suggest pulmonary embolism.  Main pulmonary artery is normal in caliber. Mediastinum: The heart is normal in size without a pericardial effusion.  The aorta and arch branches are normal in course and caliber. Prominent right perihilar and peritracheal lymph nodes are seen. Lungs/pleura: Right chest tube is in place.  Small to moderate pneumothorax persists.  Bibasilar atelectasis is identified.  Diffuse ground-glass is identified in the lung parenchyma. No significant pleural effusions. The central airways are patent.  No bronchial wall thickening or bronchiectasis. Upper Abdomen: Limited images of the upper abdomen are unremarkable. Soft Tissues/Bones: Extensive subcutaneous gas is identified along the right lateral chest wall.     1. No pulmonary embolus. 2. Small to moderate right pneumothorax with right chest tube in place. Extensive subcutaneous gas seen along the right lateral chest wall. 3. Bibasilar atelectasis. 4. Diffuse ground glass throughout the lungs is favored to represent atelectasis over pulmonary edema. 5. Prominent right peritracheal and hilar lymph nodes, possibly reactive. Recommend repeat chest CT with IV contrast in three months to evaluate for resolution.           EKG this visit:  Reviewed  Electronically signed by Nicholes Calamity, CNP on 07/05/2015 at 3:40 PM

## 2015-07-05 NOTE — ED Notes (Signed)
Bed: ED30  Expected date:   Expected time:   Means of arrival:   Comments:  Medic 6, sob     Lauris PoagMichelle L Wichael, RN  07/05/15 1031

## 2015-07-05 NOTE — ED Notes (Signed)
hospitalist paged to change order for patient to go to ICU SD instead of ICU.     Ross MarcusAmber McDevitt, RN  07/05/15 [phone]

## 2015-07-05 NOTE — Progress Notes (Signed)
Dr. Eugene GaviaSoumya Neravetla paged by dialing [phone] and leaving a verbal message.  Waiting for response.  Rodell PernaPamela J Harshitha Fretz, RN

## 2015-07-05 NOTE — ED Triage Notes (Signed)
Patient presents to ER today for chest pain and sob. Patient reports that she was doing work around the house and noticed some pressure in her mid chest rated at a 7/10 currently. Patient is also sob, and was at 87% on room air on arrival. Patient is currently at 91% on 2L NC now. Patient denies a cardiac history and denies copd/emphysema

## 2015-07-06 ENCOUNTER — Inpatient Hospital Stay: Admit: 2015-07-06 | Primary: Family Medicine

## 2015-07-06 ENCOUNTER — Encounter: Admit: 2015-07-06 | Attending: Surgery | Primary: Family Medicine

## 2015-07-06 ENCOUNTER — Encounter: Primary: Family Medicine

## 2015-07-06 LAB — COMPREHENSIVE METABOLIC PANEL
ALT: 23 U/L (ref 10–40)
AST: 26 IU/L (ref 15–37)
Albumin: 3.5 GM/DL (ref 3.4–5.0)
Alkaline Phosphatase: 68 IU/L (ref 40–128)
Anion Gap: 11 (ref 4–16)
BUN: 11 MG/DL (ref 6–23)
CO2: 28 MMOL/L (ref 21–32)
Calcium: 8.5 MG/DL (ref 8.3–10.6)
Chloride: 97 mMol/L — ABNORMAL LOW (ref 99–110)
Creatinine: 0.7 MG/DL (ref 0.6–1.1)
GFR African American: >60 mL/min/{1.73_m2} (ref >60)
GFR Non-African American: >60 mL/min/{1.73_m2} (ref >60)
Glucose: 131 MG/DL (ref 70–140)
Potassium: 3.7 MMOL/L (ref 3.5–5.1)
Sodium: 136 MMOL/L (ref 135–145)
Total Bilirubin: 0.6 MG/DL (ref 0.0–1.0)
Total Protein: 6.1 GM/DL — ABNORMAL LOW (ref 6.4–8.2)

## 2015-07-06 LAB — CBC WITH AUTO DIFFERENTIAL
Basophils %: 0.5 % (ref 0–1)
Basophils Absolute: 0.1 10*3/uL
Eosinophils %: 0.2 % (ref 0–3)
Eosinophils Absolute: 0 10*3/uL
Hematocrit: 38.2 % (ref 37–47)
Hemoglobin: 12.5 GM/DL (ref 12.5–16.0)
Immature Neutrophil %: 0.3 % (ref 0–0.43)
Lymphocytes %: 22.6 % — ABNORMAL LOW (ref 24–44)
Lymphocytes Absolute: 2.2 10*3/uL
MCH: 31.7 PG — ABNORMAL HIGH (ref 27–31)
MCHC: 32.7 % (ref 32.0–36.0)
MCV: 97 FL (ref 78–100)
MPV: 9.8 FL (ref 7.5–11.1)
Monocytes %: 7.1 % — ABNORMAL HIGH (ref 0–4)
Monocytes Absolute: 0.7 10*3/uL
Nucleated RBC %: 0 %
Platelets: 267 10*3/uL (ref 140–440)
RBC: 3.94 10*6/uL — ABNORMAL LOW (ref 4.2–5.4)
RDW: 12.9 % (ref 11.7–14.9)
Segs Absolute: 6.8 10*3/uL
Segs Relative: 69.3 % — ABNORMAL HIGH (ref 36–66)
Total Immature Neutrophil: 0.03 10*3/uL
Total Nucleated RBC: 0 10*3/uL
WBC: 9.9 10*3/uL (ref 4.0–10.5)

## 2015-07-06 LAB — TROPONIN: Troponin T: <0.01 NG/ML (ref <0.01)

## 2015-07-06 MED ORDER — TALC 5 G PL SUSR
5 g | Freq: Once | INTRAPLEURAL | Status: DC
Start: 2015-07-06 — End: 2015-07-06

## 2015-07-06 MED ORDER — HYDROCODONE-ACETAMINOPHEN 5-325 MG PO TABS
5-325 MG | ORAL | Status: DC | PRN
Start: 2015-07-06 — End: 2015-07-08

## 2015-07-06 MED ORDER — PROMETHAZINE HCL 25 MG/ML IJ SOLN
25 MG/ML | Freq: Four times a day (QID) | INTRAMUSCULAR | Status: DC | PRN
Start: 2015-07-06 — End: 2015-07-06
  Administered 2015-07-06 (×2): 25 mg via INTRAMUSCULAR

## 2015-07-06 MED ORDER — DOCUSATE SODIUM 100 MG PO CAPS
100 MG | Freq: Two times a day (BID) | ORAL | Status: DC
Start: 2015-07-06 — End: 2015-07-08
  Administered 2015-07-07: 13:00:00 100 mg via ORAL

## 2015-07-06 MED ORDER — LABETALOL HCL 5 MG/ML IV SOLN
5 MG/ML | INTRAVENOUS | Status: DC | PRN
Start: 2015-07-06 — End: 2015-07-06

## 2015-07-06 MED ORDER — MORPHINE SULFATE (PF) 4 MG/ML IV SOLN
4 MG/ML | INTRAVENOUS | Status: DC | PRN
Start: 2015-07-06 — End: 2015-07-08

## 2015-07-06 MED ORDER — FENTANYL CITRATE (PF) 100 MCG/2ML IJ SOLN
100 | INTRAMUSCULAR | Status: AC
Start: 2015-07-06 — End: 2015-07-06

## 2015-07-06 MED ORDER — DIPHENHYDRAMINE HCL 50 MG/ML IJ SOLN
50 MG/ML | Freq: Once | INTRAMUSCULAR | Status: DC | PRN
Start: 2015-07-06 — End: 2015-07-06

## 2015-07-06 MED ORDER — HYDROCODONE-ACETAMINOPHEN 5-325 MG PO TABS
5-325 MG | ORAL | Status: DC | PRN
Start: 2015-07-06 — End: 2015-07-08
  Administered 2015-07-07 – 2015-07-08 (×4): 2 via ORAL

## 2015-07-06 MED ORDER — ATENOLOL 25 MG PO TABS
25 MG | Freq: Once | ORAL | Status: AC
Start: 2015-07-06 — End: 2015-07-06
  Administered 2015-07-06: 18:00:00 25 mg via ORAL

## 2015-07-06 MED ORDER — SODIUM CHLORIDE 0.9 % IV SOLN
0.9 | INTRAVENOUS | Status: AC
Start: 2015-07-06 — End: 2015-07-06

## 2015-07-06 MED ORDER — NORMAL SALINE FLUSH 0.9 % IV SOLN
0.9 % | Freq: Two times a day (BID) | INTRAVENOUS | Status: DC
Start: 2015-07-06 — End: 2015-07-08
  Administered 2015-07-07 – 2015-07-08 (×4): 10 mL via INTRAVENOUS

## 2015-07-06 MED ORDER — HYDRALAZINE HCL 20 MG/ML IJ SOLN
20 MG/ML | INTRAMUSCULAR | Status: DC | PRN
Start: 2015-07-06 — End: 2015-07-06

## 2015-07-06 MED ORDER — SODIUM CHLORIDE 0.45 % IV SOLN
0.45 | INTRAVENOUS | Status: AC
Start: 2015-07-06 — End: 2015-07-06

## 2015-07-06 MED ORDER — METOPROLOL TARTRATE 5 MG/5ML IV SOLN
5 | INTRAVENOUS | Status: AC
Start: 2015-07-06 — End: 2015-07-06

## 2015-07-06 MED ORDER — KETOROLAC TROMETHAMINE 30 MG/ML IJ SOLN
30 | INTRAMUSCULAR | Status: AC
Start: 2015-07-06 — End: 2015-07-06

## 2015-07-06 MED ORDER — MIDAZOLAM HCL 2 MG/2ML IJ SOLN
2 | INTRAMUSCULAR | Status: AC
Start: 2015-07-06 — End: 2015-07-06

## 2015-07-06 MED ORDER — MEPERIDINE HCL 25 MG/ML IJ SOLN
25 MG/ML | INTRAMUSCULAR | Status: DC | PRN
Start: 2015-07-06 — End: 2015-07-06

## 2015-07-06 MED ORDER — SODIUM CHLORIDE 0.45 % IV SOLN
0.45 % | INTRAVENOUS | Status: DC
Start: 2015-07-06 — End: 2015-07-07
  Administered 2015-07-06: 22:00:00 via INTRAVENOUS

## 2015-07-06 MED ORDER — ONDANSETRON HCL 4 MG/2ML IJ SOLN
4 MG/2ML | Freq: Once | INTRAMUSCULAR | Status: DC | PRN
Start: 2015-07-06 — End: 2015-07-06

## 2015-07-06 MED ORDER — HYDROMORPHONE HCL 1 MG/ML IJ SOLN
1 MG/ML | INTRAMUSCULAR | Status: DC | PRN
Start: 2015-07-06 — End: 2015-07-06

## 2015-07-06 MED ORDER — HYDROMORPHONE HCL 1 MG/ML IJ SOLN
1 | INTRAMUSCULAR | Status: AC
Start: 2015-07-06 — End: 2015-07-06

## 2015-07-06 MED ORDER — MORPHINE SULFATE (PF) 2 MG/ML IV SOLN
2 MG/ML | INTRAVENOUS | Status: DC | PRN
Start: 2015-07-06 — End: 2015-07-06

## 2015-07-06 MED ORDER — NORMAL SALINE FLUSH 0.9 % IV SOLN
0.9 % | INTRAVENOUS | Status: DC | PRN
Start: 2015-07-06 — End: 2015-07-08

## 2015-07-06 MED ORDER — SUGAMMADEX SODIUM 200 MG/2ML IV SOLN
200 | INTRAVENOUS | Status: AC
Start: 2015-07-06 — End: 2015-07-06

## 2015-07-06 MED ORDER — PROPOFOL 200 MG/20ML IV EMUL
200 | INTRAVENOUS | Status: AC
Start: 2015-07-06 — End: 2015-07-06

## 2015-07-06 MED ORDER — CEFAZOLIN SODIUM-DEXTROSE 2-4 GM/100ML-% IV SOLN
2-4 GM/100ML-% | Freq: Once | INTRAVENOUS | Status: AC
Start: 2015-07-06 — End: 2015-07-06
  Administered 2015-07-06: 19:00:00 2 g via INTRAVENOUS

## 2015-07-06 MED ORDER — MORPHINE SULFATE (PF) 2 MG/ML IV SOLN
2 MG/ML | INTRAVENOUS | Status: DC | PRN
Start: 2015-07-06 — End: 2015-07-08

## 2015-07-06 MED ORDER — CEFAZOLIN SODIUM-DEXTROSE 2-4 GM/100ML-% IV SOLN
2-4 GM/100ML-% | Freq: Three times a day (TID) | INTRAVENOUS | Status: AC
Start: 2015-07-06 — End: 2015-07-07
  Administered 2015-07-07 (×2): 2 g via INTRAVENOUS

## 2015-07-06 MED ORDER — ACETAMINOPHEN 325 MG PO TABS
325 MG | ORAL | Status: DC | PRN
Start: 2015-07-06 — End: 2015-07-08

## 2015-07-06 MED ORDER — ACETAMINOPHEN 10 MG/ML IV SOLN
10 MG/ML | Freq: Once | INTRAVENOUS | Status: AC
Start: 2015-07-06 — End: 2015-07-06
  Administered 2015-07-06: 22:00:00 1000 mg via INTRAVENOUS

## 2015-07-06 MED ORDER — DEXAMETHASONE SODIUM PHOSPHATE 4 MG/ML IJ SOLN
4 | INTRAMUSCULAR | Status: AC
Start: 2015-07-06 — End: 2015-07-06

## 2015-07-06 MED ORDER — MAGNESIUM SULFATE 2000 MG/50 ML IVPB PREMIX
2 GM/50ML | Freq: Once | INTRAVENOUS | Status: DC
Start: 2015-07-06 — End: 2015-07-06

## 2015-07-06 MED ORDER — FENTANYL CITRATE (PF) 100 MCG/2ML IJ SOLN
100 MCG/2ML | INTRAMUSCULAR | Status: DC | PRN
Start: 2015-07-06 — End: 2015-07-06

## 2015-07-06 MED ORDER — ESMOLOL HCL 100 MG/10ML IV SOLN
100 | INTRAVENOUS | Status: AC
Start: 2015-07-06 — End: 2015-07-06

## 2015-07-06 MED ORDER — BUPIVACAINE LIPOSOME 1.3 % IJ SUSP
1.3 % | Freq: Once | INTRAMUSCULAR | Status: DC
Start: 2015-07-06 — End: 2015-07-06

## 2015-07-06 MED ORDER — HYDROMORPHONE HCL 1 MG/ML IJ SOLN
1 MG/ML | INTRAMUSCULAR | Status: DC | PRN
Start: 2015-07-06 — End: 2015-07-06
  Administered 2015-07-06: 15:00:00 1 mg via INTRAVENOUS

## 2015-07-06 MED ORDER — KETAMINE HCL 10 MG/ML IJ SOLN
10 | INTRAMUSCULAR | Status: AC
Start: 2015-07-06 — End: 2015-07-06

## 2015-07-06 MED ORDER — ONDANSETRON HCL 4 MG/2ML IJ SOLN
4 | INTRAMUSCULAR | Status: AC
Start: 2015-07-06 — End: 2015-07-06

## 2015-07-06 MED ORDER — FUROSEMIDE 10 MG/ML IJ SOLN
10 | INTRAMUSCULAR | Status: AC
Start: 2015-07-06 — End: 2015-07-06

## 2015-07-06 MED ORDER — NORMAL SALINE FLUSH 0.9 % IV SOLN
0.9 | INTRAVENOUS | Status: AC
Start: 2015-07-06 — End: 2015-07-06

## 2015-07-06 MED ORDER — HYDROCODONE-ACETAMINOPHEN 5-325 MG PO TABS
5-325 MG | ORAL | Status: DC | PRN
Start: 2015-07-06 — End: 2015-07-06

## 2015-07-06 MED ORDER — LIDOCAINE HCL (CARDIAC) 20 MG/ML IV SOLN
20 | INTRAVENOUS | Status: AC
Start: 2015-07-06 — End: 2015-07-06

## 2015-07-06 MED ORDER — IPRATROPIUM-ALBUTEROL 0.5-2.5 (3) MG/3ML IN SOLN
RESPIRATORY_TRACT | Status: DC
Start: 2015-07-06 — End: 2015-07-08
  Administered 2015-07-07 – 2015-07-08 (×4): 1 via RESPIRATORY_TRACT

## 2015-07-06 MED ORDER — PHENYLEPHRINE HCL 10 MG/ML IJ SOLN
10 | INTRAMUSCULAR | Status: AC
Start: 2015-07-06 — End: 2015-07-06

## 2015-07-06 MED ORDER — ONDANSETRON HCL 4 MG/2ML IJ SOLN
4 MG/2ML | Freq: Four times a day (QID) | INTRAMUSCULAR | Status: DC | PRN
Start: 2015-07-06 — End: 2015-07-08

## 2015-07-06 MED ORDER — ROCURONIUM BROMIDE 50 MG/5ML IV SOLN
50 | INTRAVENOUS | Status: AC
Start: 2015-07-06 — End: 2015-07-06

## 2015-07-06 MED ORDER — PROMETHAZINE HCL 25 MG/ML IJ SOLN
25 MG/ML | INTRAMUSCULAR | Status: DC | PRN
Start: 2015-07-06 — End: 2015-07-06

## 2015-07-06 MED FILL — CEFAZOLIN SODIUM-DEXTROSE 2-4 GM/100ML-% IV SOLN: 2-4 GM/100ML-% | INTRAVENOUS | Qty: 100

## 2015-07-06 MED FILL — FUROSEMIDE 10 MG/ML IJ SOLN: 10 MG/ML | INTRAMUSCULAR | Qty: 2

## 2015-07-06 MED FILL — DEXAMETHASONE SODIUM PHOSPHATE 4 MG/ML IJ SOLN: 4 MG/ML | INTRAMUSCULAR | Qty: 1

## 2015-07-06 MED FILL — ROCURONIUM BROMIDE 50 MG/5ML IV SOLN: 50 MG/5ML | INTRAVENOUS | Qty: 10

## 2015-07-06 MED FILL — SODIUM CHLORIDE 0.45 % IV SOLN: 0.45 % | INTRAVENOUS | Qty: 1000

## 2015-07-06 MED FILL — SIMVASTATIN 20 MG PO TABS: 20 MG | ORAL | Qty: 1

## 2015-07-06 MED FILL — TALC 5 G PL SUSR: 5 g | INTRAPLEURAL | Qty: 100

## 2015-07-06 MED FILL — PROPOFOL 200 MG/20ML IV EMUL: 200 MG/20ML | INTRAVENOUS | Qty: 20

## 2015-07-06 MED FILL — ESMOLOL HCL 100 MG/10ML IV SOLN: 100 MG/10ML | INTRAVENOUS | Qty: 10

## 2015-07-06 MED FILL — MAGNESIUM SULFATE 2 GM/50ML IV SOLN: 2 GM/50ML | INTRAVENOUS | Qty: 50

## 2015-07-06 MED FILL — NORMAL SALINE FLUSH 0.9 % IV SOLN: 0.9 % | INTRAVENOUS | Qty: 40

## 2015-07-06 MED FILL — KETOROLAC TROMETHAMINE 30 MG/ML IJ SOLN: 30 MG/ML | INTRAMUSCULAR | Qty: 1

## 2015-07-06 MED FILL — FENTANYL CITRATE (PF) 100 MCG/2ML IJ SOLN: 100 MCG/2ML | INTRAMUSCULAR | Qty: 2

## 2015-07-06 MED FILL — METOPROLOL TARTRATE 5 MG/5ML IV SOLN: 5 MG/ML | INTRAVENOUS | Qty: 5

## 2015-07-06 MED FILL — BRIDION 200 MG/2ML IV SOLN: 200 MG/2ML | INTRAVENOUS | Qty: 2

## 2015-07-06 MED FILL — HYDROMORPHONE HCL 1 MG/ML IJ SOLN: 1 MG/ML | INTRAMUSCULAR | Qty: 1

## 2015-07-06 MED FILL — ONDANSETRON HCL 4 MG/2ML IJ SOLN: 4 MG/2ML | INTRAMUSCULAR | Qty: 2

## 2015-07-06 MED FILL — ATENOLOL 25 MG PO TABS: 25 MG | ORAL | Qty: 1

## 2015-07-06 MED FILL — LIDOCAINE HCL (CARDIAC) 20 MG/ML IV SOLN: 20 MG/ML | INTRAVENOUS | Qty: 5

## 2015-07-06 MED FILL — PROMETHAZINE HCL 25 MG/ML IJ SOLN: 25 MG/ML | INTRAMUSCULAR | Qty: 1

## 2015-07-06 MED FILL — KETALAR 10 MG/ML IJ SOLN: 10 MG/ML | INTRAMUSCULAR | Qty: 20

## 2015-07-06 MED FILL — MIDAZOLAM HCL 2 MG/2ML IJ SOLN: 2 MG/ML | INTRAMUSCULAR | Qty: 2

## 2015-07-06 MED FILL — EXPAREL 1.3 % IJ SUSP: 1.3 % | INTRAMUSCULAR | Qty: 20

## 2015-07-06 MED FILL — DEXAMETHASONE SODIUM PHOSPHATE 4 MG/ML IJ SOLN: 4 MG/ML | INTRAMUSCULAR | Qty: 2

## 2015-07-06 MED FILL — SODIUM CHLORIDE 0.9 % IV SOLN: 0.9 % | INTRAVENOUS | Qty: 100

## 2015-07-06 MED FILL — DOCUSATE SODIUM 100 MG PO CAPS: 100 mg | ORAL | Qty: 1

## 2015-07-06 MED FILL — OXYCODONE-ACETAMINOPHEN 5-325 MG PO TABS: 5-325 MG | ORAL | Qty: 1

## 2015-07-06 MED FILL — VERAPAMIL HCL 80 MG PO TABS: 80 MG | ORAL | Qty: 1

## 2015-07-06 MED FILL — OFIRMEV 10 MG/ML IV SOLN: 10 MG/ML | INTRAVENOUS | Qty: 100

## 2015-07-06 MED FILL — SODIUM CHLORIDE 0.9 % IV SOLN: 0.9 % | INTRAVENOUS | Qty: 1000

## 2015-07-06 MED FILL — PHENYLEPHRINE HCL 10 MG/ML IJ SOLN: 10 MG/ML | INTRAMUSCULAR | Qty: 1

## 2015-07-06 NOTE — Consults (Signed)
Department of Cardiovascular & Thoracic Surgery   Consult Note        Reason for Consult:  Spontaneous PTX  Requesting Physician:  Welton FlakesKhan  Admitting Physician: Welton FlakesKhan     Date of Consult: 07/05/15    History Obtained From:  patient, electronic medical record    HISTORY OF PRESENT ILLNESS:    The patient is a 70 y.o. female who presents with sudden CP on the R side.  The patient took an ASA with no relief.  She presented to ED and was found to have a right spontaneous PTX.  Right chest tube was placed in ED with improvement in PTX.    PT has never had a previous PTX.  She has a history of heavy smoking but quit in 1980.  The patient lives with her husband and considers herself active.  She was attempting to paint when her pain began.    Past Medical History:        Diagnosis Date   ??? Hyperlipidemia    ??? Hypertension    ??? Thyroid disease      Past Surgical History:    History reviewed. No pertinent surgical history.  Current Medications:   Current Facility-Administered Medications: promethazine (PHENERGAN) injection 25 mg, 25 mg, Intramuscular, Q6H PRN  sodium chloride flush 0.9 % injection 10 mL, 10 mL, Intravenous, 2 times per day  sodium chloride flush 0.9 % injection 10 mL, 10 mL, Intravenous, PRN  potassium chloride (KLOR-CON M) extended release tablet 40 mEq, 40 mEq, Oral, PRN **OR** potassium chloride 20 MEQ/15ML (10%) oral solution 40 mEq, 40 mEq, Oral, PRN **OR** potassium chloride 10 mEq/100 mL IVPB (Peripheral Line), 10 mEq, Intravenous, PRN  magnesium sulfate 1 g in dextrose 5% 100 mL IVPB, 1 g, Intravenous, PRN  acetaminophen (TYLENOL) tablet 650 mg, 650 mg, Oral, Q4H PRN  magnesium hydroxide (MILK OF MAGNESIA) 400 MG/5ML suspension 30 mL, 30 mL, Oral, Daily PRN  ondansetron (ZOFRAN) injection 4 mg, 4 mg, Intravenous, Q6H PRN  enoxaparin (LOVENOX) injection 40 mg, 40 mg, Subcutaneous, Daily  oxyCODONE-acetaminophen (PERCOCET) 5-325 MG per tablet 1 tablet, 1 tablet, Oral, Q4H PRN  atenolol (TENORMIN) tablet 25  mg, 25 mg, Oral, Daily  levothyroxine (SYNTHROID) tablet 100 mcg, 100 mcg, Oral, Daily  pantoprazole (PROTONIX) tablet 40 mg, 40 mg, Oral, QAM AC  simvastatin (ZOCOR) tablet 20 mg, 20 mg, Oral, Nightly  triamterene-hydrochlorothiazide (MAXZIDE-25) 37.5-25 MG per tablet 1 tablet, 1 tablet, Oral, QAM  verapamil (CALAN) tablet 80 mg, 80 mg, Oral, TID  0.9 % sodium chloride infusion, , Intravenous, Continuous  sodium chloride flush 0.9 % injection 10 mL, 10 mL, Intravenous, 2 times per day  sodium chloride flush 0.9 % injection 10 mL, 10 mL, Intravenous, PRN  docusate sodium (COLACE) capsule 100 mg, 100 mg, Oral, BID  HYDROmorphone (DILAUDID) injection 0.5 mg, 0.5 mg, Intravenous, Q4H PRN  Allergies:  Morphine    Social History:   Social History     Social History   ??? Marital status: Married     Spouse name: N/A   ??? Number of children: N/A   ??? Years of education: N/A     Occupational History   ??? Not on file.     Social History Main Topics   ??? Smoking status: Former Smoker   ??? Smokeless tobacco: Not on file   ??? Alcohol use No   ??? Drug use: No   ??? Sexual activity: Not on file     Other Topics Concern   ???  Not on file     Social History Narrative     Family History:  History reviewed. No pertinent family history.    REVIEW OF SYSTEMS:  Active Ambulatory Problems     Diagnosis Date Noted   ??? No Active Ambulatory Problems     Resolved Ambulatory Problems     Diagnosis Date Noted   ??? No Resolved Ambulatory Problems     Past Medical History:   Diagnosis Date   ??? Hyperlipidemia    ??? Hypertension    ??? Thyroid disease        PHYSICAL EXAM:  Physical Exam  VITALS:  BP 127/73   Pulse 84   Temp 98.1 ??F (36.7 ??C) (Oral)    Resp 15   Wt 235 lb 3.2 oz (106.7 kg)   SpO2 97%   BMI 39.14 kg/m2  24HR INTAKE/OUTPUT:    Intake/Output Summary (Last 24 hours) at 07/06/15 0804  Last data filed at 07/06/15 0413   Gross per 24 hour   Intake              684 ml   Output                0 ml   Net              684 ml     CONSTITUTIONAL:  awake,  alert, cooperative, no apparent distress, and appears stated age  NECK:  Supple, symmetrical, trachea midline, no adenopathy, thyroid symmetric, not enlarged and no tenderness, skin normal  HEMATOLOGIC/LYMPHATICS:  no cervical lymphadenopathy and no supraclavicular lymphadenopathy  LUNGS:  No increased work of breathing, good air exchange, clear to auscultation bilaterally, no crackles or wheezing  CARDIOVASCULAR:  Normal apical impulse, regular rate and rhythm, normal S1 and S2, no S3 or S4, and no murmur noted  CHEST/BREASTS:  Chest tube in place R side, no exposed holes.  Grade 1 air leak on suction  ABDOMEN: obese soft nt nd  MUSCULOSKELETAL:  There is no redness, warmth, or swelling of the joints.  Full range of motion noted.  Motor strength is 5 out of 5 all extremities bilaterally.  Tone is normal.  NEUROLOGIC:  Awake, alert, oriented to name, place and time.  Cranial nerves II-XII are grossly intact.  Motor is 5 out of 5 bilaterally.  Cerebellar finger to nose, heel to shin intact.  Sensory is intact.  Babinski down going, Romberg negative, and gait is normal.  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 Tension pneumothorax [J93.0] 07/05/2015   ??? Tobacco use [Z72.0] 07/05/2015   ??? Hyperlipidemia [E78.5]    ??? Hypertension [I10]    ??? Thyroid disease [E07.9]        Secondary PTX      RECOMMENDATIONS:    1. I discussed possible blebectomy with the patient.  She is reluctant and would like to think about her options.  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    Make pt NPO and will reevaluate in AM.  Daily CXR  Cont CT to suction    Electronically signed  by Jani Files, MD on 07/06/2015 at 7:51  AM

## 2015-07-06 NOTE — Progress Notes (Addendum)
Progress Note (Hospitalist, Internal Medicine)  IDENTIFYING INFORMATION   PATIENT:  Tiffany Little  MRN:  [id number]  ADMIT DATE: 07/05/2015  TIME OF EVALUATION: 07/06/2015 2:44 PM      HISTORY OF PRESENT ILLNESS     Tiffany Little is a 70 y.o. female who presents with sudden onset of CP/ SOB. Reports she was performing typical household activites with acute onset about an hour prior to presentation to ED. Symptoms are described as right sided chest pain/ pressure sensation that radiated to neck. Current pain is rated 8/10. While in ED the patient was found to have tension pneumothorax that required emergent CT placement right sided posterior-lateral. Improved symptoms and oxygen saturation post intervention. Describes "electric shock" sensation anterior left chest wall and inframammary skin folds during assessment resulting in patient almost dislodging chest tube. Denies trauma or symptoms just prior to symptoms. She does report elevated BP recently and HA.   Ten point ROS reviewed negative, unless as noted above    SUBJECTIVE     Noted pain not relieved by IV pain meds at this morning    Feels "squeezing sensation"      MEDICATIONS   Medications Prior to Admission  Prescriptions Prior to Admission: levothyroxine (SYNTHROID) 100 MCG tablet, Take 100 mcg by mouth daily  simvastatin (ZOCOR) 20 MG tablet, Take 20 mg by mouth nightly  omeprazole (PRILOSEC) 20 MG delayed release capsule, Take 20 mg by mouth daily  triamterene-hydrochlorothiazide (DYAZIDE) 37.5-25 MG per capsule, Take 1 capsule by mouth every morning  verapamil (CALAN) 80 MG tablet, Take 80 mg by mouth 3 times daily  atenolol (TENORMIN) 25 MG tablet, Take 25 mg by mouth daily    Current Medications  Current Facility-Administered Medications   Medication Dose Route Frequency Provider Last Rate Last Dose   ??? [MAR Hold] promethazine (PHENERGAN) injection 25 mg  25 mg Intramuscular Q6H PRN Abel Presto, MD   25 mg at 07/06/15 0754   ??? [MAR Hold] HYDROmorphone  (DILAUDID) injection 1 mg  1 mg Intravenous Q4H PRN Alva Garnet, MD   1 mg at 07/06/15 1112   ??? [MAR Hold] bupivacaine liposome (EXPAREL) 1.3 % injection 66.5 mg  5 mL Subcutaneous Once Eugene Gavia, MD       ??? [MAR Hold] talc powder   Intrapleural Once Eugene Gavia, MD       ??? [MAR Hold] sodium chloride flush 0.9 % injection 10 mL  10 mL Intravenous 2 times per day Pete Pelt, MD   10 mL at 07/06/15 0757   ??? [MAR Hold] sodium chloride flush 0.9 % injection 10 mL  10 mL Intravenous PRN Pete Pelt, MD       ??? [facility] Hold] potassium chloride (KLOR-CON M) extended release tablet 40 mEq  40 mEq Oral PRN Pete Pelt, MD        Or   ??? [facility] Hold] potassium chloride 20 MEQ/15ML (10%) oral solution 40 mEq  40 mEq Oral PRN Pete Pelt, MD        Or   ??? [facility] Hold] potassium chloride 10 mEq/100 mL IVPB (Peripheral Line)  10 mEq Intravenous PRN Pete Pelt, MD       ??? [facility] Hold] magnesium sulfate 1 g in dextrose 5% 100 mL IVPB  1 g Intravenous PRN Pete Pelt, MD       ??? [MAR Hold] acetaminophen (TYLENOL) tablet 650 mg  650 mg Oral Q4H PRN Pete Pelt, MD       ??? [facility]  Hold] magnesium hydroxide (MILK OF MAGNESIA) 400 MG/5ML suspension 30 mL  30 mL Oral Daily PRN Pete Pelt, MD       ??? [facility] Hold] ondansetron [facility]) injection 4 mg  4 mg Intravenous Q6H PRN Pete Pelt, MD   4 mg at 07/05/15 2207   ??? [MAR Hold] enoxaparin (LOVENOX) injection 40 mg  40 mg Subcutaneous Daily Pete Pelt, MD   Stopped at 07/06/15 0759   ??? [MAR Hold] oxyCODONE-acetaminophen (PERCOCET) 5-325 MG per tablet 1 tablet  1 tablet Oral Q4H PRN Pete Pelt, MD   1 tablet at 07/05/15 2359   ??? [MAR Hold] atenolol (TENORMIN) tablet 25 mg  25 mg Oral Daily Nicholes Calamity, CNP   Stopped at 07/06/15 0758   ??? [MAR Hold] levothyroxine (SYNTHROID) tablet 100 mcg  100 mcg Oral Daily Nicholes Calamity, CNP   Stopped at 07/06/15 0715   ??? [MAR Hold] pantoprazole (PROTONIX) tablet 40 mg  40 mg Oral QAM AC Nicholes Calamity, CNP   Stopped at 07/06/15 0715   ??? [MAR Hold] simvastatin (ZOCOR)  tablet 20 mg  20 mg Oral Nightly Nicholes Calamity, CNP   20 mg at 07/05/15 2105   ??? [MAR Hold] triamterene-hydrochlorothiazide (MAXZIDE-25) 37.5-25 MG per tablet 1 tablet  1 tablet Oral QAM Nicholes Calamity, CNP   Stopped at 07/06/15 0800   ??? [MAR Hold] verapamil (CALAN) tablet 80 mg  80 mg Oral TID Nicholes Calamity, CNP   Stopped at 07/06/15 0800   ??? [MAR Hold] 0.9 % sodium chloride infusion   Intravenous Continuous Nicholes Calamity, CNP 75 mL/hr at 07/06/15 0757     ??? [MAR Hold] sodium chloride flush 0.9 % injection 10 mL  10 mL Intravenous 2 times per day Nicholes Calamity, CNP   10 mL at 07/06/15 0757   ??? [MAR Hold] sodium chloride flush 0.9 % injection 10 mL  10 mL Intravenous PRN Nicholes Calamity, CNP       ??? [MAR Hold] docusate sodium (COLACE) capsule 100 mg  100 mg Oral BID Nicholes Calamity, CNP   Stopped at 07/06/15 0758   ??? [MAR Hold] HYDROmorphone (DILAUDID) injection 0.5 mg  0.5 mg Intravenous Q4H PRN Pete Pelt, MD   0.5 mg at 07/06/15 1610         Allergies  Allergies   Allergen Reactions   ??? Morphine Nausea And Vomiting       REVIEW OF SYSTEMS     14 point review of systems reviewed. Pertinent positive or negative as per HPI or otherwise negative per 14 point systems review.     Reviewed 07/06/2015 at 2:44 PM    PHYSICAL EXAM       Blood pressure 121/75, pulse 95, temperature 98.2 ??F (36.8 ??C), temperature source Oral, resp. rate 19, height  (1.626 m), weight 233 lb (105.7 kg), SpO2 (!) 89 %.    General - AAO x 3  Psych - Appropriate affect/speech. No agitation  Eyes - PEARL. Eye lids intact. No scleral icterus  ENT - Oral mucosa pink, dentition intact. External ear clear/dry/intact. No thyromegaly  Lymphatics - No cervical/inguinal lympadenopathy   Neuro - No gross peripheral or central neuro deficits with intact CN 2-12 exam  Heart - Sinus. RRR. S1 and S2 present. No added HS/murmurs appreciated. No elevated JVD appreciated. No calf swellings/erythema  Lung - Adequate air entry b/l, chest tube on right side, no wheeze appreaciated,  scant bibasal crackles  GI - Soft, non-tender. No hepatosplenomegaly/ascities. BS+  GU -  No CVA/suprapubic tenderness or palpable bladder distension  Skin - Intact. No rash/petechiae/ecchymosis. Warm extremities  MSK - Joints with normal ROM. No joint swellings      Lines/Drains/Airways/Wounds:  @LDABRIEF @    LABS AND IMAGING   CBC  @CBCROUNDS @    Last 3 Hemoglobin  Lab Results   Component Value Date    HGB 12.5 07/06/2015    HGB 13.5 07/05/2015    HGB 15.3 04/18/2014     Last 3 WBC/ANC  Lab Results   Component Value Date    WBC 9.9 07/06/2015    WBC 7.7 07/05/2015    WBC 6.4 04/18/2014     No components found for: GRNLOCTYABS  Last 3 Platelets  No results found for: PLATELET  Chemistry  @CHEMROUNDS @  @LYTESROUNDS @  No results found for: LDH  Coagulation Studies  Lab Results   Component Value Date    INR 0.98 07/05/2015     Liver Function Studies  Lab Results   Component Value Date    ALT 23 07/06/2015    AST 26 07/06/2015    ALKPHOS 68 07/06/2015       Recent Imaging  XR Chest Standard TWO VW [[id number]] Collected: 07/06/15 0929    ?? Order Status: Completed Specimen: Chest Updated: 07/06/15 0957   ?? Narrative: ??   ?? EXAMINATION:  TWO VIEWS OF THE CHEST    07/06/2015 9:26 am    COMPARISON:  Jul 05, 2015    HISTORY:  ORDERING SYSTEM PROVIDED HISTORY: pneumothorax monitoring  TECHNOLOGIST PROVIDED HISTORY:  Ordering Physician Provided Reason for Exam: pneumothorax monitoring  Acuity: Unknown  Type of Encounter: Ongoing  Relevant Medical/Surgical History: htn    Right pneumothorax follow-up.    FINDINGS:  Previously seen right pneumothorax is increased in size, now measuring 6.5 cm  from the visceral to parietal pleural distance, compared to 2.0 cm previously.    Descending thoracic aorta is tortuous. ??The cardiac silhouette is within  normal range. ??Left lung is clear.     ?? Impression: ??   ?? 1. Interval increase in size of previously seen right pneumothorax, now  measuring 6.5 cm from the visceral to parietal pleural  distance, compared to  2 cm previously.  The findings were sent to the Radiology Results Communication Center at 9:30  am on 5/4/2017to be communicated to a licensed caregiver.     ?? CTA Pulmonary With Contrast [[id number]] Collected: 07/05/15 1442   ?? Order Status: Completed Updated: 07/06/15 [id number]   ?? Narrative: ??   ?? EXAMINATION:  CTA OF THE CHEST 07/05/2015 2:00 pm    TECHNIQUE:  CTA of the chest was performed after the administration of intravenous  contrast. ??Multiplanar reformatted images are provided for review. ??MIP  images are provided for review. Dose modulation, iterative reconstruction,  and/or weight based adjustment of the mA/kV was utilized to reduce the  radiation dose to as low as reasonably achievable.    COMPARISON:  Radiograph dated Jul 05, 2015    HISTORY:  ORDERING SYSTEM PROVIDED HISTORY: right sided chest pain with hypoxia  TECHNOLOGIST PROVIDED HISTORY:  Ordering Physician Provided Reason for Exam: right sided chest pain with  hypoxia, pneumo, nki  Acuity: Acute  Type of Encounter: Initial  Additional signs and symptoms: none  Relevant Medical/Surgical History: 100ml optiray 320/ gfr>60/ chest tube just  put in, in the er/ no surgery    FINDINGS:  Pulmonary Arteries: Pulmonary arteries are adequately opacified for  evaluation. ??No evidence of intraluminal filling defect  to suggest pulmonary  embolism. ??Main pulmonary artery is normal in caliber.    Mediastinum: The heart is normal in size without a pericardial effusion. ??The  aorta and arch branches are normal in course and caliber.    Prominent right perihilar and peritracheal lymph nodes are seen.    Lungs/pleura: Right chest tube is in place. ??Small to moderate pneumothorax  persists. ??Bibasilar atelectasis is identified. ??Diffuse ground-glass is  identified in the lung parenchyma.    No significant pleural effusions.    The central airways are patent. ??No bronchial wall thickening or  bronchiectasis.    Upper Abdomen: Limited images of the  upper abdomen are unremarkable.    Soft Tissues/Bones: Extensive subcutaneous gas is identified along the right  lateral chest wall.     ?? Impression: ??   ?? 1. No pulmonary embolus.  2. Small to moderate right pneumothorax with right chest tube in place.  Extensive subcutaneous gas seen along the right lateral chest wall.  3. Bibasilar atelectasis.  4. Diffuse ground glass throughout the lungs is favored to represent  atelectasis over pulmonary edema.  5. Prominent right peritracheal and hilar lymph nodes, possibly reactive.  Recommend repeat chest CT with IV contrast in three months to evaluate for  resolution.     ?? XR Chest Portable [[id number]] Collected: 07/05/15 1737   ?? Order Status: Completed Updated: 07/05/15 1747   ?? Narrative: ??   ?? EXAMINATION:  SINGLE VIEW OF THE CHEST    07/05/2015 5:31 pm    COMPARISON:  07/05/2015    HISTORY:  ORDERING SYSTEM PROVIDED HISTORY: shortness of breath  TECHNOLOGIST PROVIDED HISTORY:  Ordering Physician Provided Reason for Exam: shortness of breath  Acuity: Acute  Type of Encounter: Subsequent/Follow-up  Additional signs and symptoms: none  Relevant Medical/Surgical History: htn    FINDINGS:  A right chest tube remains in place. ??There has been a decrease in the size  of the right pneumothorax. ??The heart size is stable. ??No new airspace  disease.     ?? Impression: ??   ?? Decrease in the size of the right pneumothorax. ??Otherwise, stable chest     ?? XR Chest Portable [[id number]]    ?? Order Status: Canceled    ?? XR Chest Portable [[id number]] Collected: 07/05/15 1417   ?? Order Status: Completed Updated: 07/05/15 1424   ?? Narrative: ??   ?? EXAMINATION:  SINGLE VIEW OF THE CHEST    07/05/2015 1:36 pm    COMPARISON:  Jul 05, 2015    HISTORY:  ORDERING SYSTEM PROVIDED HISTORY: check tube placement  TECHNOLOGIST PROVIDED HISTORY:  Ordering Physician Provided Reason for Exam: tube placement  Acuity: Unknown  Type of Encounter: Unknown  Additional signs and symptoms: ct  placement    FINDINGS:  Right chest tube has been placed. ??Side hole projects just beyond the ribs.  Right lung has been partially re-expanded. ??Small-moderate right pneumothorax  remains.    Left lung is clear.    Mild cardiomegaly. ??No pulmonary edema.     ?? Impression: ??   ?? Interval right chest tube placement with partial re-expansion of the right  lung. ??Small -moderate right pneumothorax remains.    Side hole of the chest tube projects just beyond the lateral margin of the  chest. ??Consider slightly advancing.     ?? XR Chest Portable [[id number]] Collected: 07/05/15 1201   ?? Order Status: Completed Updated: 07/05/15 1313   ?? Narrative: ??   ?? EXAMINATION:  SINGLE  VIEW OF THE CHEST    07/05/2015 11:52 am    COMPARISON:  None    HISTORY:  ORDERING SYSTEM PROVIDED HISTORY: Chest pain  TECHNOLOGIST PROVIDED HISTORY:  Ordering Physician Provided Reason for Exam: Chest pain  Acuity: Acute  Type of Encounter: Initial  Additional signs and symptoms: Patient presents to ER today for chest pain  and sob. Patient reports that she was doing work around the house and noticed  some pressure in her mid chest    FINDINGS:  There is a moderate to large right pneumothorax. ??There is leftward  mediastinal shift. ??Airspace opacities to portions of right lung likely  relate to partial collapse relating to the pneumothorax. ??Left lung appears  clear. ??Cardiac and mediastinal silhouettes are within normal limits. ??No  acute bony abnormalities.     ?? Impression: ??   ?? Moderate to large right pneumothorax with evidence for tension.    Critical results were called by Dr. Doy Hutching. Neckers, MD to MATTHEW S  BRUMFIELD on 07/05/2015 at 12:03.     ??           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

## 2015-07-06 NOTE — Brief Op Note (Signed)
Brief Postoperative Note    Paschal DoppSusan M Klingel  Date of Birth:  1945-07-03  [id number]    Pre-operative Diagnosis: secondary R PTX    Post-operative Diagnosis: Same    Procedure: Robotic R blebectomy, Apical pleural tent    Anesthesia: General    Surgeons/Assistants: Damarrion Mimbs/Neravetla    Estimated Blood Loss: 50    Complications: None    Specimens: Was Obtained: R apical bleb    Findings: see operative noe    Electronically signed by Jani FilesAMIT Meila Berke, MD on 07/07/2015 at 12:06 AM

## 2015-07-06 NOTE — Anesthesia Post-Procedure Evaluation (Signed)
Anesthesia Post-op Note    Patient: Tiffany DoppSusan M Bauers  MRN: [id number]  Birthdate: December 16, 1945  Date of evaluation: 07/06/2015  Time:  5:31 PM     Procedure(s) Performed:     Last Vitals: BP 121/75   Pulse 95   Temp 98.2 ??F (36.8 ??C) (Oral)    Resp 19   Ht 5\' 4"  (1.626 m)   Wt 233 lb (105.7 kg)   SpO2 (!) 89%   BMI 39.99 kg/m2    Aldrete Phase I: Aldrete Score: 9    Aldrete Phase II:      Anesthesia Post Evaluation    Final anesthesia type: general  Patient location during evaluation: PACU  Patient participation: complete - patient participated  Level of consciousness: awake  Airway patency: patent  Nausea & Vomiting: no nausea  Complications: no  Cardiovascular status: blood pressure returned to baseline and hemodynamically stable  Respiratory status: acceptable        Galilea Quito M Etta Gassett, DO  5:31 PM

## 2015-07-06 NOTE — Plan of Care (Signed)
Problem: Falls - Risk of  Goal: Absence of falls  Outcome: Ongoing    Problem: Pain:  Goal: Pain level will decrease  Pain level will decrease   Outcome: Ongoing  Goal: Control of acute pain  Control of acute pain   Outcome: Ongoing  Goal: Control of chronic pain  Control of chronic pain   Outcome: Ongoing

## 2015-07-06 NOTE — Progress Notes (Signed)
PATIENT NAME: Tiffany Little     TODAY'S DATE: 07/07/2015    SUBJECTIVE:    Pt  Very agitated and SOB.     OBJECTIVE:   VITALS:  BP 105/67   Pulse 78   Temp 97.8 ??F (36.6 ??C) (Oral)    Resp 10   Ht  (1.626 m)   Wt 235 lb 14.3 oz (107 kg)   SpO2 99%   BMI 40.49 kg/m2    INTAKE/OUTPUT:  In: 1587 [I.V.:1511]  Out: 1190 [Urine:975]                                 CONSTITUTIONAL:  awake, alert, cooperative, no apparent distress, and appears stated age and angry  NECK:  Supple, symmetrical, trachea midline, no adenopathy, thyroid symmetric, not enlarged and no tenderness, skin normal  HEMATOLOGIC/LYMPHATICS:  no cervical lymphadenopathy and no supraclavicular lymphadenopathy  LUNGS:  Diminished R side  CARDIOVASCULAR:  Normal apical impulse, regular rate and rhythm, normal S1 and S2, no S3 or S4, and no murmur noted  CHEST/BREASTS:  CT tube site with drain that has been pulled back to 4.  Site bloody  ABDOMEN: abd soft nt nd obese  MUSCULOSKELETAL:  There is no redness, warmth, or swelling of the joints.  Full range of motion noted.  Motor strength is 5 out of 5 all extremities bilaterally.  Tone is normal.  NEUROLOGIC:  Awake, alert, oriented to name, place and time.  Cranial nerves II-XII are grossly intact.  Motor is 5 out of 5 bilaterally.  Cerebellar finger to nose, heel to shin intact.  Sensory is intact.  Babinski down going, Romberg negative, and gait is normal.  SKIN:  no bruising or bleeding and normal skin color, texture, turgor  VASC: 1+ fem arterial bilaterally      Data:  CBC:   Recent Labs      07/05/15   1113  07/06/15   0357   WBC  7.7  9.9   HGB  13.5  12.5   HCT  40.9  38.2   PLT  287  267     BMP:    Recent Labs      07/05/15   1113  07/06/15   0357   NA  135  136   K  3.2*  3.7   CL  95*  97*   CO2  25  28   BUN  14  11   CREATININE  0.7  0.7   GLUCOSE  147*  131     Hepatic:   Recent Labs      07/05/15   1113  07/06/15   0357   AST  29  26   ALT  26  23   BILITOT  0.5  0.6   ALKPHOS  73  68      Mag:    No results for input(s): MG in the last 72 hours.   Phos:   No results for input(s): PHOS in the last 72 hours.   INR:   Recent Labs      07/05/15   1113   INR  0.98       Radiology Review:  New CXR today - large R sided PTX with CT withdrawn out of chest      ASSESSMENT AND PLAN:   Secondary PTX   Active Hospital Problems    Diagnosis Date Noted   ???  Tension pneumothorax [J93.0] 07/05/2015   ??? Tobacco use [Z72.0] 07/05/2015   ??? Hyperlipidemia [E78.5]    ??? Hypertension [I10]    ??? Thyroid disease [E07.9]        1. I have discussed 2 options:    Place new CT     Take to OR to wedge resection.  After much discussion the patient has agreed to proceed with surgery.  Risks and benefits d/w pt and husband.  They agree to proceed.    OR today    Alyn Riedinger

## 2015-07-06 NOTE — Progress Notes (Signed)
Patient complains of right shoulder pain. Stated has a bad shoulder and had pain in that area before surgery. Medicated per Fonnie BirkenheadM. Morris CRNA.

## 2015-07-06 NOTE — Progress Notes (Signed)
Dr. Enriqueta ShutterSurender Little returned phone call.  He said it was ok to give the morphine.  Give zofran with it.

## 2015-07-06 NOTE — Progress Notes (Signed)
Antibiotic not available at this time.  Per pharmacy, will send soon.    Patient is allergic to morphine (nausea and vomiting) and is on her mar prn for pain.  Dr. Claretta FraiseNeravetla called to clarify order.  Awaiting orders.  Patient not complaining of pain at this time.

## 2015-07-06 NOTE — Progress Notes (Signed)
Husband is now bedside.

## 2015-07-06 NOTE — Progress Notes (Signed)
Patient received per Enid Derryarole, RN (Pacu).  Patient was drowsy, but oriented times 4.  She states that her pain is minimal.  2    Chest tube is connected and there is no air leak.  -20 suction  Dressings are intact.  Top right chest is saturated with red blood.  The other 2 smaller dressings are clean, dry and intact.  Chest tube dressing has some old drainage (marked per pacu) and a very small amount of new drainage (marked by me).  VSS  Patient does not want to see family right now.  States that she wants to rest.

## 2015-07-06 NOTE — Anesthesia Pre-Procedure Evaluation (Signed)
Department of Anesthesiology  Preprocedure Note       Name:  Tiffany Little   Age:  70 y.o.  DOB:  [birth year]                                          MRN:  [id number]         Date:  07/06/2015      Surgeon:    Procedure:    Surgeon's note: I discussed possible blebectomy with the patient. She is reluctant and would like to think about her options. 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      Medications prior to admission:   Prior to Admission medications    Medication Sig Start Date End Date Taking? Authorizing Provider   levothyroxine (SYNTHROID) 100 MCG tablet Take 100 mcg by mouth daily 05/11/15  Yes Historical Provider, MD   simvastatin (ZOCOR) 20 MG tablet Take 20 mg by mouth nightly   Yes Historical Provider, MD   omeprazole (PRILOSEC) 20 MG delayed release capsule Take 20 mg by mouth daily   Yes Historical Provider, MD   triamterene-hydrochlorothiazide (DYAZIDE) 37.5-25 MG per capsule Take 1 capsule by mouth every morning   Yes Historical Provider, MD   verapamil (CALAN) 80 MG tablet Take 80 mg by mouth 3 times daily   Yes Historical Provider, MD   atenolol (TENORMIN) 25 MG tablet Take 25 mg by mouth daily   Yes Historical Provider, MD       Current medications:    Current Facility-Administered Medications   Medication Dose Route Frequency Provider Last Rate Last Dose   ??? promethazine (PHENERGAN) injection 25 mg  25 mg Intramuscular Q6H PRN Abel Presto, MD   25 mg at 07/06/15 0754   ??? HYDROmorphone (DILAUDID) injection 1 mg  1 mg Intravenous Q4H PRN Alva Garnet, MD   1 mg at 07/06/15 1112   ??? sodium chloride flush 0.9 % injection 10 mL  10 mL Intravenous 2 times per day Pete Pelt, MD   10 mL at 07/06/15 0757   ??? sodium chloride flush 0.9 % injection 10 mL  10 mL Intravenous PRN Pete Pelt, MD       ??? potassium chloride (KLOR-CON M) extended release tablet 40 mEq  40 mEq Oral PRN Pete Pelt, MD        Or   ??? potassium  chloride 20 MEQ/15ML (10%) oral solution 40 mEq  40 mEq Oral PRN Pete Pelt, MD        Or   ??? potassium chloride 10 mEq/100 mL IVPB (Peripheral Line)  10 mEq Intravenous PRN Pete Pelt, MD       ??? magnesium sulfate 1 g in dextrose 5% 100 mL IVPB  1 g Intravenous PRN Pete Pelt, MD       ??? acetaminophen (TYLENOL) tablet 650 mg  650 mg Oral Q4H PRN Pete Pelt, MD       ??? magnesium hydroxide (MILK OF MAGNESIA) 400 MG/5ML suspension 30 mL  30 mL Oral Daily PRN Pete Pelt, MD       ??? ondansetron [facility]) injection 4 mg  4 mg Intravenous Q6H PRN Pete Pelt, MD   4 mg at 07/05/15 2207   ??? enoxaparin (LOVENOX) injection 40 mg  40 mg Subcutaneous Daily Sarah  Welton Flakes, MD   Stopped at 07/06/15 0759   ??? oxyCODONE-acetaminophen (PERCOCET) 5-325 MG per tablet 1 tablet  1 tablet Oral Q4H PRN Pete Pelt, MD   1 tablet at 07/05/15 2359   ??? atenolol (TENORMIN) tablet 25 mg  25 mg Oral Daily Nicholes Calamity, CNP   Stopped at 07/06/15 0454   ??? levothyroxine (SYNTHROID) tablet 100 mcg  100 mcg Oral Daily Nicholes Calamity, CNP   Stopped at 07/06/15 0715   ??? pantoprazole (PROTONIX) tablet 40 mg  40 mg Oral QAM AC Nicholes Calamity, CNP   Stopped at 07/06/15 0715   ??? simvastatin (ZOCOR) tablet 20 mg  20 mg Oral Nightly Nicholes Calamity, CNP   20 mg at 07/05/15 2105   ??? triamterene-hydrochlorothiazide (MAXZIDE-25) 37.5-25 MG per tablet 1 tablet  1 tablet Oral QAM Nicholes Calamity, CNP   Stopped at 07/06/15 0800   ??? verapamil (CALAN) tablet 80 mg  80 mg Oral TID Nicholes Calamity, CNP   Stopped at 07/06/15 0800   ??? 0.9 % sodium chloride infusion   Intravenous Continuous Nicholes Calamity, CNP 75 mL/hr at 07/06/15 0757     ??? sodium chloride flush 0.9 % injection 10 mL  10 mL Intravenous 2 times per day Nicholes Calamity, CNP   10 mL at 07/06/15 0757   ??? sodium chloride flush 0.9 % injection 10 mL  10 mL Intravenous PRN Nicholes Calamity, CNP       ??? docusate sodium (COLACE) capsule 100 mg  100 mg Oral BID Nicholes Calamity, CNP   Stopped at 07/06/15 0758   ??? HYDROmorphone (DILAUDID) injection 0.5 mg   0.5 mg Intravenous Q4H PRN Pete Pelt, MD   0.5 mg at 07/06/15 0981       Allergies:    Allergies   Allergen Reactions   ??? Morphine Nausea And Vomiting       Problem List:    Patient Active Problem List   Diagnosis Code   ??? Hypertension I10   ??? Hyperlipidemia E78.5   ??? Thyroid disease E07.9   ??? Tension pneumothorax J93.0   ??? Tobacco use Z72.0       Past Medical History:        Diagnosis Date   ??? Hyperlipidemia    ??? Hypertension    ??? Thyroid disease        Past Surgical History:  History reviewed. No pertinent surgical history.    Social History:    Social History   Substance Use Topics   ??? Smoking status: Former Smoker   ??? Smokeless tobacco: Not on file   ??? Alcohol use No                                Counseling given: Not Answered      Vital Signs (Current):   Vitals:    07/06/15 0413 07/06/15 0800 07/06/15 0815 07/06/15 1115   BP: 118/71 127/73  121/75   Pulse: 81 84  95   Resp: 12 15  19    Temp: 98 ??F (36.7 ??C) 98.1 ??F (36.7 ??C)  98.2 ??F (36.8 ??C)   TempSrc: Oral Oral  Oral   SpO2: 97% 97%  (!) 89%   Weight: 235 lb 3.2 oz (106.7 kg)  233 lb (105.7 kg)    Height:   5\' 4"  (1.626 m)  BP Readings from Last 3 Encounters:   07/06/15 121/75   02/28/15 (!) 149/68       NPO Status: Time of last liquid consumption: 0000                        Time of last solid consumption: 2300                        Date of last liquid consumption: 07/06/15                        Date of last solid food consumption: 07/05/15    BMI:   Wt Readings from Last 3 Encounters:   07/06/15 233 lb (105.7 kg)   02/28/15 225 lb (102.1 kg)     Body mass index is 39.99 kg/(m^2).      CBC  Lab Results   Component Value Date/Time    WBC 9.9 07/06/2015 03:57 AM    HGB 12.5 07/06/2015 03:57 AM    HCT 38.2 07/06/2015 03:57 AM    PLT 267 07/06/2015 03:57 AM     RENAL  Lab Results   Component Value Date/Time    NA 136 07/06/2015 03:57 AM    K 3.7 07/06/2015 03:57 AM    CL 97 (L) 07/06/2015 03:57 AM    CO2 28  07/06/2015 03:57 AM    BUN 11 07/06/2015 03:57 AM    CREATININE 0.7 07/06/2015 03:57 AM    GLUCOSE 131 07/06/2015 03:57 AM     COAGS  Lab Results   Component Value Date/Time    PROTIME 11.2 07/05/2015 11:13 AM    INR 0.98 07/05/2015 11:13 AM    APTT 26.5 07/05/2015 11:13 AM   Anesthesia Evaluation  Patient summary reviewed   history of anesthetic complications: PONV  Airway: Mallampati: II  TM distance: >3 FB   Neck ROM: full  Mouth opening: > = 3 FB Dental: normal exam         Pulmonary:       ROS comment: Tension PTX     Cardiovascular:    (+) hypertension:, hyperlipidemia        Rhythm: regular  Rate: normal                 Neuro/Psych:      GI/Hepatic/Renal:           Endo/Other:    (+) hypothyroidism::.      Abdominal:   (+) obese,     Abdomen: soft.             Anesthesia Plan    ASA 3 - emergent     general   (Pre Anesthesia Evaluation completed.   Anesthesia plan, risks, benefits, and alternatives discussed.  The common postop problems such as pain, nausea and vomiting, itching, and shivering immediately postoperatively as well as the possible sore throat and eye irritations for couple of days after.   The rare possibilities of dealing with abnormal heart rate, blood pressure, or respiration as well as managing surgical complications, a pre-exciting medical condition, or a medication reaction were also mentioned.  I further assured that the anesthesia team will provide the best care and work on avoiding and managing any problem.  Patient verbalized an understanding and agreed to proceed. She also understands the possible need for postop ventilation   )  intravenous induction   Anesthetic plan and risks discussed with  patient and spouse (Brother).    Plan discussed with CRNA.  arterial line          Naaman Plummer, MD   07/06/2015

## 2015-07-06 NOTE — Op Note (Signed)
DATE OF SURGERY: 07/06/15    PREOPERATIVE DIAGNOSIS: R Pneumothorax, Apical Bleb  ????  POSTOPERATIVE DIAGNOSIS: same    SURGEONS: Kateri PlummerSoumya R Makael Stein, MD/Amit Wilford CornerArora, MD    ASSISTANT:Phil Shelly CossFerrel, PAC  ????  OPERATION: R Robotic VATS,  RUL wedge (bleb) resection, pleurectomy,  intercostal nerve block     TISSUES REMOVED: RUL wedge  ????  DRAINS: One 24-French Blake.  ????  ANESTHESIA: General with double-lumen tube.  ????  POSITION: Left lateral decubitus, right side up.  ????  FINDINGS: RUL blebs   ????  DESCRIPTION OF OPERATION: The patient was brought to the operating room and intubated with a double-lumen tube. Adequate placement was verified by bronchoscopy by the anesthesia department and myself.  ????  The patient was then turned in the Left lateral decubitus position on the beanbag with the bed flexed. The ET tube placement was then reconfirmed for adequate positioning of the double-lumen tube. The patient was then prepped and draped in that position.  ????  The trocar sites were injected with local anesthetic. A 5 mm port was placed posteriorly in approximately the sixth intercostal space under direct visualization.   ????  The chest was insufflated with CO2. A 12  mm robotic port was placed in approximately the 8th intercostal space in the anteriorly. A 8 mm robotic port was placed in midaxillary line under direct visualization.  The 5 mm posterior port was exchanged for an 8 mm robotic port.  The robot was then docked to the patient.   ??  The lung was examined.  The stapler was then introduced. The lung was examined and there was an area of multiple blebs in the right upper lobe. This was resected with serial firings of the blue load and was removed through the 12mm port.   ??  An apical pleurectomy was then performed used bipolar cautery. The chest was then examined for hemostasis which was adequate.  ????  A 24-French Blake chest tube was placed through the anterior port. The chest tube was secured with silk suture. The remaining  ports were closed with UR6 for subcutaneous. and a 4-0 Monocryl was used for the subcuticular suture. Sterile dressings were placed over the incisions. The patient tolerated the procedure well. Instrument and sponge counts were correct at the conclusion of the case.  ??

## 2015-07-06 NOTE — Progress Notes (Signed)
TO SDS for preop care.  Husband @ bedside.  T&S drawn and sent to lab.  Portable monitor continues.

## 2015-07-06 NOTE — Progress Notes (Signed)
Received from OR. Monitors applied, alarms on. Report obtained from Fonnie BirkenheadM. Morris CRNA. CT in place with 30cc dark red drainage in pleuravac. No air leak noted. Nasal airway inserted left nares per Fonnie BirkenheadM. Morris CRNA. Good air exchange noted. No crepitus noted at surgical sites.

## 2015-07-06 NOTE — Progress Notes (Signed)
Report given to Maralyn SagoSarah RN, pt to be transferred to room 2118 after surgery. Belongings taken to room 2118, pt's husband came by rm 2015 to get his belongings.

## 2015-07-06 NOTE — Progress Notes (Signed)
Chest x-ray taken. Nasal airway out per patient request. Coughs and deep breathes with encouragement. States shoulder was feeling better. Turned and repositioned in bed. Warm blankets on.

## 2015-07-06 NOTE — Progress Notes (Signed)
Patient resting with eyes closed. No family in waiting room to update at this time.

## 2015-07-06 NOTE — Progress Notes (Signed)
Transported to room 2118. Report given to and care assumed per [facility]. Brother updated and informed that patient just wanted to rest right now and not have any visitors till later.

## 2015-07-07 ENCOUNTER — Inpatient Hospital Stay: Admit: 2015-07-07 | Primary: Family Medicine

## 2015-07-07 LAB — BASIC METABOLIC PANEL
Anion Gap: 11 (ref 4–16)
BUN: 11 MG/DL (ref 6–23)
CO2: 27 MMOL/L (ref 21–32)
Calcium: 8.4 MG/DL (ref 8.3–10.6)
Chloride: 102 mMol/L (ref 99–110)
Creatinine: 0.7 MG/DL (ref 0.6–1.1)
GFR African American: >60 mL/min/{1.73_m2} (ref >60)
GFR Non-African American: >60 mL/min/{1.73_m2} (ref >60)
Glucose: 126 MG/DL (ref 70–140)
Potassium: 4.3 MMOL/L (ref 3.5–5.1)
Sodium: 140 MMOL/L (ref 135–145)

## 2015-07-07 LAB — CBC
Hematocrit: 40.5 % (ref 37–47)
Hemoglobin: 12.8 GM/DL (ref 12.5–16.0)
MCH: 32.2 PG — ABNORMAL HIGH (ref 27–31)
MCHC: 31.6 % — ABNORMAL LOW (ref 32.0–36.0)
MCV: 102 FL — ABNORMAL HIGH (ref 78–100)
MPV: 9.9 FL (ref 7.5–11.1)
Platelets: 272 10*3/uL (ref 140–440)
RBC: 3.97 10*6/uL — ABNORMAL LOW (ref 4.2–5.4)
RDW: 13.2 % (ref 11.7–14.9)
WBC: 15.1 10*3/uL — ABNORMAL HIGH (ref 4.0–10.5)

## 2015-07-07 MED ORDER — CALCIUM CHLORIDE 10 % IV SOLN
10 | INTRAVENOUS | Status: AC
Start: 2015-07-07 — End: 2015-07-07

## 2015-07-07 MED ORDER — PROPOFOL 200 MG/20ML IV EMUL
200 | INTRAVENOUS | Status: AC
Start: 2015-07-07 — End: 2015-07-07

## 2015-07-07 MED ORDER — SODIUM CHLORIDE 0.9 % IJ SOLN
0.9 | INTRAMUSCULAR | Status: AC
Start: 2015-07-07 — End: 2015-07-07

## 2015-07-07 MED ORDER — LIDOCAINE HCL (CARDIAC) 20 MG/ML IV SOLN
20 | INTRAVENOUS | Status: AC
Start: 2015-07-07 — End: 2015-07-07

## 2015-07-07 MED ORDER — MUPIROCIN 2 % EX OINT
2 % | Freq: Two times a day (BID) | CUTANEOUS | Status: DC
Start: 2015-07-07 — End: 2015-07-08
  Administered 2015-07-07 – 2015-07-08 (×3): via NASAL

## 2015-07-07 MED ORDER — FENTANYL CITRATE (PF) 1000 MCG/20ML IJ SOLN
1000 | INTRAMUSCULAR | Status: AC
Start: 2015-07-07 — End: 2015-07-07

## 2015-07-07 MED ORDER — MIDAZOLAM HCL 2 MG/2ML IJ SOLN
2 | INTRAMUSCULAR | Status: AC
Start: 2015-07-07 — End: 2015-07-07

## 2015-07-07 MED ORDER — PHENYLEPHRINE HCL 10 MG/ML IJ SOLN
10 | INTRAMUSCULAR | Status: AC
Start: 2015-07-07 — End: 2015-07-07

## 2015-07-07 MED ORDER — SUCCINYLCHOLINE CHLORIDE 20 MG/ML IJ SOLN
20 | INTRAMUSCULAR | Status: AC
Start: 2015-07-07 — End: 2015-07-07

## 2015-07-07 MED FILL — CALCIUM CHLORIDE 10 % IV SOLN: 10 % | INTRAVENOUS | Qty: 10

## 2015-07-07 MED FILL — PROPOFOL 200 MG/20ML IV EMUL: 200 MG/20ML | INTRAVENOUS | Qty: 20

## 2015-07-07 MED FILL — IPRATROPIUM-ALBUTEROL 0.5-2.5 (3) MG/3ML IN SOLN: RESPIRATORY_TRACT | Qty: 3

## 2015-07-07 MED FILL — CEFAZOLIN SODIUM-DEXTROSE 2-4 GM/100ML-% IV SOLN: 2-4 GM/100ML-% | INTRAVENOUS | Qty: 100

## 2015-07-07 MED FILL — MUPIROCIN 2 % EX OINT: 2 % | CUTANEOUS | Qty: 1

## 2015-07-07 MED FILL — HYDROCODONE-ACETAMINOPHEN 5-325 MG PO TABS: 5-325 MG | ORAL | Qty: 2

## 2015-07-07 MED FILL — LIDOCAINE HCL (CARDIAC) 20 MG/ML IV SOLN: 20 MG/ML | INTRAVENOUS | Qty: 5

## 2015-07-07 MED FILL — PHENYLEPHRINE HCL 10 MG/ML IJ SOLN: 10 MG/ML | INTRAMUSCULAR | Qty: 1

## 2015-07-07 MED FILL — DOCUSATE SODIUM 100 MG PO CAPS: 100 MG | ORAL | Qty: 1

## 2015-07-07 MED FILL — SODIUM CHLORIDE 0.9 % IJ SOLN: 0.9 % | INTRAMUSCULAR | Qty: 10

## 2015-07-07 MED FILL — QUELICIN 20 MG/ML IJ SOLN: 20 MG/ML | INTRAMUSCULAR | Qty: 10

## 2015-07-07 MED FILL — FENTANYL CITRATE (PF) 1000 MCG/20ML IJ SOLN: 1000 MCG/20ML | INTRAMUSCULAR | Qty: 20

## 2015-07-07 MED FILL — MIDAZOLAM HCL 2 MG/2ML IJ SOLN: 2 MG/ML | INTRAMUSCULAR | Qty: 6

## 2015-07-07 NOTE — Other (Signed)
Dr.   Wilford CornerArora was here, placed chest tube to water seal

## 2015-07-07 NOTE — Care Coordination-Inpatient (Signed)
Patient asleep at this time. CM to follow.

## 2015-07-07 NOTE — Progress Notes (Signed)
Patient is verbally aggressive at this time about her current situation and her hunger.  Patient tolerated ice chips and sips of water well.  Gave patient 2 graham crackers.

## 2015-07-07 NOTE — Progress Notes (Signed)
Type and Reason for Visit: Initial (ICU DOS evaluation)    Nutrition Screen:   ?? Have you recently lost weight without trying? ??? 0 to 1 pound (0 points)   ?? Have you been eating poorly because of a decreased appetite? ??? No (0 points)   ?? Malnutrition Screening Tool Score ??? 0    Dietitian Assessment of Nutrition Re-Screen: Admitted to ICU via ED after being found to have tension pneumothorax. Denies decreased appetite and unplanned wt loss PTA.  No significant past Epic weights available for comparison.  Pt is morbidly obese.  Diet is general.  No po records on file as yet.  Do not anticipate significant nutritional concerns to develop.  Will follow clinical course and complete full assessment and plan as per nutritional protocoll              Electronically signed by Darliss RidgelElizabeth A Huckleberry Martinson, RD, LD on 07/07/15 at 10:46 AM    Contact Number: [phone]

## 2015-07-07 NOTE — Progress Notes (Signed)
Progress Note (Hospitalist, Internal Medicine)  IDENTIFYING INFORMATION   PATIENT:  Tiffany Little  MRN:  [id number]  ADMIT DATE: 07/05/2015  TIME OF EVALUATION: 07/07/2015 2:04 PM      HISTORY OF PRESENT ILLNESS     Tiffany Little is a 70 y.o. female who presents with sudden onset of CP/ SOB. Reports she was performing typical household activites with acute onset about an hour prior to presentation to ED. Symptoms are described as right sided chest pain/ pressure sensation that radiated to neck. Current pain is rated 8/10. While in ED the patient was found to have tension pneumothorax that required emergent CT placement right sided posterior-lateral. Improved symptoms and oxygen saturation post intervention. Describes "electric shock" sensation anterior left chest wall and inframammary skin folds during assessment resulting in patient almost dislodging chest tube. Denies trauma or symptoms just prior to symptoms. She does report elevated BP recently and HA.   Ten point ROS reviewed negative, unless as noted above    SUBJECTIVE     Resting comfortably in bed      MEDICATIONS   Medications Prior to Admission  Prescriptions Prior to Admission: levothyroxine (SYNTHROID) 100 MCG tablet, Take 100 mcg by mouth daily  simvastatin (ZOCOR) 20 MG tablet, Take 20 mg by mouth nightly  omeprazole (PRILOSEC) 20 MG delayed release capsule, Take 20 mg by mouth daily  triamterene-hydrochlorothiazide (DYAZIDE) 37.5-25 MG per capsule, Take 1 capsule by mouth every morning  verapamil (CALAN) 80 MG tablet, Take 80 mg by mouth 3 times daily  atenolol (TENORMIN) 25 MG tablet, Take 25 mg by mouth daily    Current Medications  Current Facility-Administered Medications   Medication Dose Route Frequency Provider Last Rate Last Dose   ??? mupirocin (BACTROBAN) 2 % ointment   Nasal BID Pete Pelt, MD       ??? sodium chloride flush 0.9 % injection 10 mL  10 mL Intravenous 2 times per day Jani Files, MD   10 mL at 07/07/15 0849   ??? sodium chloride flush  0.9 % injection 10 mL  10 mL Intravenous PRN Jani Files, MD       ??? acetaminophen (TYLENOL) tablet 650 mg  650 mg Oral Q4H PRN Jani Files, MD       ??? HYDROcodone-acetaminophen (NORCO) 5-325 MG per tablet 1 tablet  1 tablet Oral Q4H PRN Jani Files, MD        Or   ??? HYDROcodone-acetaminophen (NORCO) 5-325 MG per tablet 2 tablet  2 tablet Oral Q4H PRN Jani Files, MD   2 tablet at 07/07/15 0848   ??? morphine (PF) injection 2 mg  2 mg Intravenous Q2H PRN Jani Files, MD        Or   ??? morphine (PF) injection 4 mg  4 mg Intravenous Q2H PRN Jani Files, MD       ??? docusate sodium (COLACE) capsule 100 mg  100 mg Oral BID Jani Files, MD   100 mg at 07/07/15 0849   ??? ondansetron (ZOFRAN) injection 4 mg  4 mg Intravenous Q6H PRN Jani Files, MD       ??? ipratropium-albuterol (DUONEB) nebulizer solution 1 ampule  1 ampule Inhalation Q4H WA Jani Files, MD   1 ampule at 07/07/15 6578         Allergies  Allergies   Allergen Reactions   ??? Morphine Nausea And Vomiting       REVIEW OF SYSTEMS     14 point review of systems  reviewed. Pertinent positive or negative as per HPI or otherwise negative per 14 point systems review.     Reviewed 07/07/2015 at 2:04 PM    PHYSICAL EXAM       Blood pressure 94/61, pulse 92, temperature 98.1 ??F (36.7 ??C), temperature source Oral, resp. rate 18, height  (1.626 m), weight 235 lb 14.3 oz (107 kg), SpO2 95 %.    General - Resting comfortably in bed  Eyes - PEARL. Eye lids intact. No scleral icterus  ENT - Oral mucosa pink, dentition intact. External ear clear/dry/intact. No thyromegaly  Lymphatics - No cervical/inguinal lympadenopathy   Neuro - No gross peripheral or central neuro deficits with intact CN 2-12 exam  Heart - Sinus. RRR. S1 and S2 present. No added HS/murmurs appreciated. No elevated JVD appreciated. No calf swellings/erythema  Lung - Adequate air entry b/l, chest tube on right side, no wheeze appreaciated, scant bibasal crackles  GI - Soft, non-tender. No hepatosplenomegaly/ascities.  BS+  GU - No CVA/suprapubic tenderness or palpable bladder distension  Skin - Intact. No rash/petechiae/ecchymosis. Warm extremities  MSK - Joints with normal ROM. No joint swellings      Lines/Drains/Airways/Wounds:  @    LABS AND IMAGING   CBC  @    Last 3 Hemoglobin  Lab Results   Component Value Date    HGB 12.8 07/07/2015    HGB 12.5 07/06/2015    HGB 13.5 07/05/2015     Last 3 WBC/ANC  Lab Results   Component Value Date    WBC 15.1 07/07/2015    WBC 9.9 07/06/2015    WBC 7.7 07/05/2015     No components found for: GRNLOCTYABS  Last 3 Platelets  No results found for: PLATELET  Chemistry  @  @  No results found for: LDH  Coagulation Studies  Lab Results   Component Value Date    INR 0.98 07/05/2015     Liver Function Studies  Lab Results   Component Value Date    ALT 23 07/06/2015    AST 26 07/06/2015    ALKPHOS 68 07/06/2015       Recent Imaging  XR Chest Standard TWO VW [[id number]] Collected: 07/06/15 0929    ?? Order Status: Completed Specimen: Chest Updated: 07/06/15 0957   ?? Narrative: ??   ?? EXAMINATION:  TWO VIEWS OF THE CHEST    07/06/2015 9:26 am    COMPARISON:  Jul 05, 2015    HISTORY:  ORDERING SYSTEM PROVIDED HISTORY: pneumothorax monitoring  TECHNOLOGIST PROVIDED HISTORY:  Ordering Physician Provided Reason for Exam: pneumothorax monitoring  Acuity: Unknown  Type of Encounter: Ongoing  Relevant Medical/Surgical History: htn    Right pneumothorax follow-up.    FINDINGS:  Previously seen right pneumothorax is increased in size, now measuring 6.5 cm  from the visceral to parietal pleural distance, compared to 2.0 cm previously.    Descending thoracic aorta is tortuous. ??The cardiac silhouette is within  normal range. ??Left lung is clear.     ?? Impression: ??   ?? 1. Interval increase in size of previously seen right pneumothorax, now  measuring 6.5 cm from the visceral to parietal pleural distance, compared to  2 cm previously.  The findings were sent to the Radiology  Results Communication Center at 9:30  am on 5/4/2017to be communicated to a licensed caregiver.     ?? CTA Pulmonary With Contrast [[id number]] Collected: 07/05/15 1442   ?? Order Status: Completed Updated: 07/06/15 8657   ??  Narrative: ??   ?? EXAMINATION:  CTA OF THE CHEST 07/05/2015 2:00 pm    TECHNIQUE:  CTA of the chest was performed after the administration of intravenous  contrast. ??Multiplanar reformatted images are provided for review. ??MIP  images are provided for review. Dose modulation, iterative reconstruction,  and/or weight based adjustment of the mA/kV was utilized to reduce the  radiation dose to as low as reasonably achievable.    COMPARISON:  Radiograph dated Jul 05, 2015    HISTORY:  ORDERING SYSTEM PROVIDED HISTORY: right sided chest pain with hypoxia  TECHNOLOGIST PROVIDED HISTORY:  Ordering Physician Provided Reason for Exam: right sided chest pain with  hypoxia, pneumo, nki  Acuity: Acute  Type of Encounter: Initial  Additional signs and symptoms: none  Relevant Medical/Surgical History: 100ml optiray 320/ gfr>60/ chest tube just  put in, in the er/ no surgery    FINDINGS:  Pulmonary Arteries: Pulmonary arteries are adequately opacified for  evaluation. ??No evidence of intraluminal filling defect to suggest pulmonary  embolism. ??Main pulmonary artery is normal in caliber.    Mediastinum: The heart is normal in size without a pericardial effusion. ??The  aorta and arch branches are normal in course and caliber.    Prominent right perihilar and peritracheal lymph nodes are seen.    Lungs/pleura: Right chest tube is in place. ??Small to moderate pneumothorax  persists. ??Bibasilar atelectasis is identified. ??Diffuse ground-glass is  identified in the lung parenchyma.    No significant pleural effusions.    The central airways are patent. ??No bronchial wall thickening or  bronchiectasis.    Upper Abdomen: Limited images of the upper abdomen are unremarkable.    Soft Tissues/Bones: Extensive subcutaneous gas  is identified along the right  lateral chest wall.     ?? Impression: ??   ?? 1. No pulmonary embolus.  2. Small to moderate right pneumothorax with right chest tube in place.  Extensive subcutaneous gas seen along the right lateral chest wall.  3. Bibasilar atelectasis.  4. Diffuse ground glass throughout the lungs is favored to represent  atelectasis over pulmonary edema.  5. Prominent right peritracheal and hilar lymph nodes, possibly reactive.  Recommend repeat chest CT with IV contrast in three months to evaluate for  resolution.     ?? XR Chest Portable [[id number]][[id number]] Collected: 07/05/15 1737   ?? Order Status: Completed Updated: 07/05/15 1747   ?? Narrative: ??   ?? EXAMINATION:  SINGLE VIEW OF THE CHEST    07/05/2015 5:31 pm    COMPARISON:  07/05/2015    HISTORY:  ORDERING SYSTEM PROVIDED HISTORY: shortness of breath  TECHNOLOGIST PROVIDED HISTORY:  Ordering Physician Provided Reason for Exam: shortness of breath  Acuity: Acute  Type of Encounter: Subsequent/Follow-up  Additional signs and symptoms: none  Relevant Medical/Surgical History: htn    FINDINGS:  A right chest tube remains in place. ??There has been a decrease in the size  of the right pneumothorax. ??The heart size is stable. ??No new airspace  disease.     ?? Impression: ??   ?? Decrease in the size of the right pneumothorax. ??Otherwise, stable chest     ?? XR Chest Portable [[id number]][[id number]]    ?? Order Status: Canceled    ?? XR Chest Portable [[id number]][[id number]] Collected: 07/05/15 1417   ?? Order Status: Completed Updated: 07/05/15 1424   ?? Narrative: ??   ?? EXAMINATION:  SINGLE VIEW OF THE CHEST    07/05/2015 1:36 pm    COMPARISON:  Jul 05, 2015    HISTORY:  ORDERING SYSTEM PROVIDED HISTORY: check tube placement  TECHNOLOGIST PROVIDED HISTORY:  Ordering Physician Provided Reason for Exam: tube placement  Acuity: Unknown  Type of Encounter: Unknown  Additional signs and symptoms: ct placement    FINDINGS:  Right chest tube has been placed. ??Side hole projects just beyond the  ribs.  Right lung has been partially re-expanded. ??Small-moderate right pneumothorax  remains.    Left lung is clear.    Mild cardiomegaly. ??No pulmonary edema.     ?? Impression: ??   ?? Interval right chest tube placement with partial re-expansion of the right  lung. ??Small -moderate right pneumothorax remains.    Side hole of the chest tube projects just beyond the lateral margin of the  chest. ??Consider slightly advancing.     ?? XR Chest Portable [[id number]] Collected: 07/05/15 1201   ?? Order Status: Completed Updated: 07/05/15 1313   ?? Narrative: ??   ?? EXAMINATION:  SINGLE VIEW OF THE CHEST    07/05/2015 11:52 am    COMPARISON:  None    HISTORY:  ORDERING SYSTEM PROVIDED HISTORY: Chest pain  TECHNOLOGIST PROVIDED HISTORY:  Ordering Physician Provided Reason for Exam: Chest pain  Acuity: Acute  Type of Encounter: Initial  Additional signs and symptoms: Patient presents to ER today for chest pain  and sob. Patient reports that she was doing work around the house and noticed  some pressure in her mid chest    FINDINGS:  There is a moderate to large right pneumothorax. ??There is leftward  mediastinal shift. ??Airspace opacities to portions of right lung likely  relate to partial collapse relating to the pneumothorax. ??Left lung appears  clear. ??Cardiac and mediastinal silhouettes are within normal limits. ??No  acute bony abnormalities.     ?? Impression: ??   ?? Moderate to large right pneumothorax with evidence for tension.    Critical results were called by Dr. Doy Hutching. Neckers, MD to MATTHEW S  BRUMFIELD on 07/05/2015 at 12:03.     ??           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 D/w CT surgery who will take over care of patient. Will sign off. Happy to assist with pain medication or other issues in  the evening    Pain control  - PO norco and IV morphine    HTN  HLD  - continue statin, anti-HTN    Hypothyroid  - continue synthroid        Gotham Raden [employer], Internal Medicine  07/07/2015 at 2:04 PM

## 2015-07-07 NOTE — Plan of Care (Signed)
Problem: Falls - Risk of  Goal: Absence of falls  Outcome: Ongoing    Problem: Pain:  Goal: Pain level will decrease  Pain level will decrease   Outcome: Ongoing  Goal: Control of acute pain  Control of acute pain   Outcome: Ongoing  Goal: Control of chronic pain  Control of chronic pain   Outcome: Ongoing

## 2015-07-07 NOTE — Progress Notes (Signed)
Instructed patient on use of Incentive Spirometer.  Advised patient to use spirometer 10x an hour.  Patient achieved 1000.

## 2015-07-07 NOTE — Progress Notes (Signed)
0400 assessment    Old chest tube site is saturated.  No changes from last assessment and is what the dressing looked like upon arrival to ICU.  Bruising present.    Lap sites:  One is clean, dry and intact.  The other has some new serosanguinous drainage.  Both sites with bruising.    New chest tube dressing has scant amount of new drainage to bandage, but is not saturated.  Bruising present. Tubes were stripped and milked.  Tube is patent and there is no air leak.      Patient says she does not have any pain unless she moves a lot.

## 2015-07-07 NOTE — Progress Notes (Signed)
PATIENT NAME: Paschal DoppSusan M Broz    TODAY'S DATE: 07/07/15    SUBJECTIVE:    Pt  Is feeling well.       OBJECTIVE:   VITALS:    Vitals:    07/07/15 0903   BP: 80/67   Pulse: 92   Resp: 19   Temp:    SpO2: (!) 84%     INTAKE/OUTPUT:    Date 07/07/15 0000 - 07/07/15 2359   Shift 0000-0759 [id number] 1600-2359 24 Hour Total   I  N  T  A  K  E   P.O. 240   240    I.V.  (mL/kg/hr) 417  (0.5)   417    Shift Total  (mL/kg) 657  (6.1)   657  (6.1)   O  U  T  P  U  T   Urine  (mL/kg/hr) 700  (0.8)   700    Chest Tube 15   15    Shift Total  (mL/kg) 715  (6.7)   715  (6.7)   Weight (kg) 107 107 107 107        EXAM:  Blood pressure 80/67, pulse 92, temperature 98.1 ??F (36.7 ??C), temperature source Oral, resp. rate 19, height 5\' 4"  (1.626 m), weight 235 lb 14.3 oz (107 kg), SpO2 (!) 84 %.  General appearance: No apparent distress, appears stated age and cooperative.  Skin: unremarkable  HEENT Normocephalic, atraumatic without obvious deformity.   Neck: Supple, Trachea midline   Lungs: Good respiratory effort. Clear to auscultation, bilaterally, inc c/d/i, CT with no air leak or fluctuation  Heart: Regular rate/ rhythm  Abdomen: Soft, non-tender or non-distended   Extremities: no edema warm well perfused  Neurologic: Alert, grossly intact  Mental status: normal affect      Data:  CBC:   Recent Labs      07/05/15   1113  07/06/15   0357  07/07/15   0645   WBC  7.7  9.9  15.1*   HGB  13.5  12.5  12.8   HCT  40.9  38.2  40.5   PLT  287  267  272     BMP:  Recent Labs      07/05/15   1113  07/06/15   0357  07/07/15   0645   NA  135  136  140   K  3.2*  3.7  4.3   CL  95*  97*  102   CO2  25  28  27    BUN  14  11  11    CREATININE  0.7  0.7  0.7   GLUCOSE  147*  131  126     Hepatic: Recent Labs      07/05/15   1113  07/06/15   0357   AST  29  26   ALT  26  23   BILITOT  0.5  0.6   ALKPHOS  73  68     Mag:    No results for input(s): MG in the last 72 hours.   Phos:   No results for input(s): PHOS in the last 72 hours.   INR:   Recent  Labs      07/05/15   1113   INR  0.98     Radiology Review:  CXR with stable apical space      ASSESSMENT AND PLAN:  S/P R robotic blebectomy  Patient Active Problem List   Diagnosis   ???  Hypertension   ??? Hyperlipidemia   ??? Thyroid disease   ??? Tension pneumothorax   ??? Tobacco use       Doing well  CT to [facility], CXR at 2pm, may remove CT if ok  Remove foley  Ambulate  Wean O2   Po pain meds  Home when pain controlled and off O2

## 2015-07-07 NOTE — Other (Signed)
Dr. Hipolito BayleyS. Neravetla here, removed right chest tube without difficulty, patient tolerated well

## 2015-07-07 NOTE — Other (Addendum)
Patient Acct Nbr:  [id number]  Primary AUTH/CERT:    Primary Insurance Company Name:   [employer] [facility]  Primary Insurance Plan Name:  [employer] CHOICE PPO MEDICARE  Primary Insurance Group Number:    Primary Insurance Plan Type: N  Primary Insurance Policy Number:  [id number]

## 2015-07-08 MED ORDER — LEVOTHYROXINE SODIUM 100 MCG PO TABS
100 MCG | Freq: Every day | ORAL | Status: DC
Start: 2015-07-08 — End: 2015-07-08
  Administered 2015-07-08: 14:00:00 100 ug via ORAL

## 2015-07-08 MED ORDER — TRIAMTERENE-HCTZ 37.5-25 MG PO TABS
Freq: Every morning | ORAL | Status: DC
Start: 2015-07-08 — End: 2015-07-08
  Administered 2015-07-08: 14:00:00 1 via ORAL

## 2015-07-08 MED ORDER — ATENOLOL 25 MG PO TABS
25 MG | Freq: Every day | ORAL | Status: DC
Start: 2015-07-08 — End: 2015-07-08
  Administered 2015-07-08: 14:00:00 25 mg via ORAL

## 2015-07-08 MED FILL — SYNTHROID 100 MCG PO TABS: 100 MCG | ORAL | Qty: 1

## 2015-07-08 MED FILL — MUPIROCIN 2 % EX OINT: 2 % | CUTANEOUS | Qty: 1

## 2015-07-08 MED FILL — IPRATROPIUM-ALBUTEROL 0.5-2.5 (3) MG/3ML IN SOLN: RESPIRATORY_TRACT | Qty: 3

## 2015-07-08 MED FILL — ATENOLOL 25 MG PO TABS: 25 MG | ORAL | Qty: 1

## 2015-07-08 MED FILL — HYDROCODONE-ACETAMINOPHEN 5-325 MG PO TABS: 5-325 MG | ORAL | Qty: 2

## 2015-07-08 MED FILL — DOCUSATE SODIUM 100 MG PO CAPS: 100 MG | ORAL | Qty: 1

## 2015-07-08 MED FILL — MAXZIDE-25 37.5-25 MG PO TABS: ORAL | Qty: 1

## 2015-07-08 NOTE — Discharge Instructions (Signed)
No strenuous activity. No lifting greater than 5lbs. No pushing and pulling. Ambulation as tolerated until follow-up appointment.

## 2015-07-08 NOTE — Discharge Instructions (Signed)
???   Good nutrition is important when healing from an illness, injury, or surgery.  Follow any nutrition recommendations given to you during your hospital stay.   ??? If you were given an oral nutrition supplement while in the hospital, continue to take this supplement at home.  You can take it with meals, in-between meals, and/or before bedtime. These supplements can be purchased at most local grocery stores, pharmacies, and chain super-stores.   ??? If you have any questions about your diet or nutrition, call the hospital and ask for the dietitian.    General diet as tolerated.

## 2015-07-08 NOTE — Discharge Summary (Signed)
Discharge Summary     Patient ID  Tiffany DoppSusan M Little   03-Nov-1945  [id number]      Primary Care Doctor:  Aggie MoatsPamela Daufel, MD    Admit date: 07/05/2015   Discharge date: 07/12/2015      Admitting Physician: Jani FilesAmit Clayvon Parlett, MD   Discharge Physician: Jani FilesAMIT Almin Livingstone MD    Discharge Diagnoses:   Active Hospital Problems    Diagnosis Date Noted   ??? Tension pneumothorax [J93.0] 07/05/2015   ??? Tobacco use [Z72.0] 07/05/2015   ??? Hyperlipidemia [E78.5]    ??? Hypertension [I10]    ??? Thyroid disease [E07.9]      Discharged Condition: stable.    Hospital Course:  Upon admission underwent surgery of Robotic bleb resection after discussion and preparation.  Tolerated surgery well   Post op ; monitored rhythm, O2 sat, I/O, Labs, CXRs serially  Neuro status , BP and vital signs monitored  Started on diet and activity.  Uneventful recovery  At discharge, pt ambulating  Tolerating diet  Wounds clean  Had Bm  Lungs clear   NSR  VS at discharge   Vitals:    07/08/15 1302   BP: 115/66   Pulse: 83   Resp: 16   Temp:    SpO2: 94%       Consults: none    Significant Diagnostic Studies: none    Disposition: home    Patient Instructions:      Medication List      ASK your doctor about these medications          atenolol 25 MG tablet   Commonly known as:  TENORMIN       DULoxetine 30 MG extended release capsule   Commonly known as:  CYMBALTA       levothyroxine 100 MCG tablet   Commonly known as:  SYNTHROID       omeprazole 20 MG delayed release capsule   Commonly known as:  PRILOSEC       simvastatin 20 MG tablet   Commonly known as:  ZOCOR   What changed:  Another medication with the same name was removed. Continue taking this medication, and follow the directions you see here.       triamterene-hydrochlorothiazide 37.5-25 MG per capsule   Commonly known as:  DYAZIDE       verapamil 80 MG tablet   Commonly known as:  CALAN           Activity: activity as tolerated and no lifting, Driving, or Strenuous exercise until seen by physician in office  Diet: cardiac  diet  Wound Care: as directed    Follow-up as directed at discharge    Signed: Aima Mcwhirt    Time spent on discharge 35 minutes

## 2015-07-08 NOTE — Plan of Care (Signed)
Problem: Falls - Risk of  Goal: Absence of falls  Outcome: Completed Date Met:  07/08/15    Problem: Pain:  Goal: Pain level will decrease  Pain level will decrease   Outcome: Completed Date Met:  07/08/15  Goal: Control of acute pain  Control of acute pain   Outcome: Completed Date Met:  07/08/15  Goal: Control of chronic pain  Control of chronic pain   Outcome: Completed Date Met:  07/08/15

## 2015-07-10 LAB — EKG 12-LEAD
Atrial Rate: 75 {beats}/min
Diagnosis: NORMAL
P Axis: 0 degrees
P-R Interval: 140 ms
Q-T Interval: 420 ms
QRS Duration: 80 ms
QTc Calculation (Bazett): 469 ms
R Axis: -9 degrees
T Axis: 29 degrees
Ventricular Rate: 75 {beats}/min

## 2015-07-11 NOTE — Telephone Encounter (Signed)
left message to confirm appt

## 2015-07-12 ENCOUNTER — Ambulatory Visit
Admit: 2015-07-12 | Discharge: 2015-07-12 | Payer: PRIVATE HEALTH INSURANCE | Attending: Physical Medicine & Rehabilitation | Primary: Family Medicine

## 2015-07-12 DIAGNOSIS — M5417 Radiculopathy, lumbosacral region: Secondary | ICD-10-CM

## 2015-07-12 NOTE — Progress Notes (Signed)
EMG REPORT     CHIEF COMPLAINT: "My legs give out".    HISTORY OF PRESENT ILLNESS: 70 year old R hand dominant female with several months of "falling w/o warning". She denied current lower leg pains, but " they are just numb". She has chronic spinal pain following both remote cervical and lumbar surgeries. She denied any rashes, limb discoloration or swelling. No recent limb cramping or foot pains. She rated her spinal pain as 10/10. No DM or thyroid disorder.      PHYSICAL EXAMINATION: Alert. Increased lumbar lordosis with both thoracic and lumbar paraspinal muscular tenderness to light palpation. Normal hip and knee PROM. Neg SLR. 1+/= KJ's. No AJ's. 5/5 LE MMT. Diminished EDB bulk bilaterally. Blunted perception to light touch in both feet.      NERVE CONDUCTION STUDIES:     MOTOR         LATENCY NORMAL AMPLITUDE DISTANCE COND. VEL.   R  PERONEAL    < 6.2 msec  8 cm    L  PERONEAL 5.7 < 6.2 msec 2.2 8 cm 48   RIGHT  TIBIAL  < 6.2 msec  8 cm    LEFT TIBIAL 5.9 < 6.2 msec 2.0 8 cm >50   R TIB H REFLEX Absent < 50 msec      L TIB H REFLEX Absent < 50 msec         SENSORY  ANTIDROMIC        LATENCY NORMAL AMPLITUDE DISTANCE   R SUP PERONEAL  < 3.6 msec  10 cm   L SUP PERONEAL 2.8 < 3.6 msec 6 10 cm   RIGHT  SURAL 3.0 < 4.0 msec 9 14 cm   LEFT  SURAL 3.3 < 4.0 msec 15 14 cm         NEEDLE EMG:      RIGHT   LEFT     Insertional Activity Spontaneous  Activity Volutional  MUAP's Insertional Activity Spontaneous  Activity Volutional  MUAP's   Lumbar paraspinals Increased +++ Polys Increased +++ Polys   Thoracic paraspinals Increased ++ Polys Increased ++ Polys   Glut Med Normal None Normal Normal None Normal   Rect fem Normal None Normal Normal None Normal   Vast Med Normal None Normal Normal None Normal   Ant Tibialis Normal None Normal Normal None Normal   EHL Normal None Dec #, Larger Normal None Dec #, Larger   FDL Normal None " Normal None "   Gastroc Normal None " Normal None "   EDB Normal None " Normal None "    1st dors int Normal None Polys Normal None Polys       FINDINGS:   EMG of the lumbar paraspinals and the LE's demonstrated significant paraspinal muscle membrane irritability. No limb positive waves or fibrillations were noted, but significantly neuropathic motor units were seen throughout the S1 myotomes of both LE's. Tibial H reflexes were missing. Otherwise, sensory and motor latencies, evoked amplitudes and conduction velocities were acceptable.      IMPRESSION:      1. Abnormal EMG. 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  Her history, exam and EMG raises a concern for lumbar spinal stenosis.     2. No evidence of a concurrent lumbar plexopathy, generalized peripheral neuropathy or primary muscle disease.          Thank you for this interesting referral.

## 2015-07-19 ENCOUNTER — Encounter: Attending: Thoracic Surgery (Cardiothoracic Vascular Surgery) | Primary: Family Medicine

## 2015-08-02 ENCOUNTER — Ambulatory Visit
Admit: 2015-08-02 | Discharge: 2015-08-02 | Payer: PRIVATE HEALTH INSURANCE | Attending: Thoracic Surgery (Cardiothoracic Vascular Surgery) | Primary: Family Medicine

## 2015-08-02 DIAGNOSIS — J9311 Primary spontaneous pneumothorax: Secondary | ICD-10-CM

## 2015-08-02 NOTE — Progress Notes (Signed)
Tiffany DoppSusan M Little   [birth year]   70 y.o.  female    Chief Complaint   Patient presents with   ??? Post-Op Check     stated Abcess         HPI:  Patient being seen in the office today for evaluation of stated painful lump to right side.  Patient states redness and warmth present.  Patient states present since 5-6 days after surgery and growing in size.  Patient is post op Robotic RUL Bleb Resection on 07/06/15.  The patient was admitted to an outside hospital due to dizziness and a fall.  The patient was evaluated for infection.  CT chest was performed along with a cardiac work up    Past Medical History:   Diagnosis Date   ??? Hyperlipidemia    ??? Hypertension    ??? Thyroid disease       Past Surgical History:   Procedure Laterality Date   ??? LUNG SURGERY Right 07/06/2015    Robotic right apical bleb resection     Allergies   Allergen Reactions   ??? Morphine Nausea And Vomiting     Current Outpatient Prescriptions   Medication Sig Dispense Refill   ??? levothyroxine (SYNTHROID) 100 MCG tablet Take 100 mcg by mouth daily     ??? simvastatin (ZOCOR) 20 MG tablet Take 20 mg by mouth nightly     ??? omeprazole (PRILOSEC) 20 MG delayed release capsule Take 20 mg by mouth daily     ??? triamterene-hydrochlorothiazide (DYAZIDE) 37.5-25 MG per capsule Take 1 capsule by mouth every morning     ??? verapamil (CALAN) 80 MG tablet Take 80 mg by mouth 3 times daily     ??? atenolol (TENORMIN) 25 MG tablet Take 25 mg by mouth daily       No current facility-administered medications for this visit.       No family history on file.    Review of Systems:       Review of Systems   Constitutional: Negative for chills, fever and unexpected weight change.   HENT: Negative for nosebleeds and voice change.    Respiratory: Negative for apnea, chest tightness and shortness of breath.    Cardiovascular: Negative for chest pain and palpitations.   Gastrointestinal: Negative for abdominal pain, nausea and vomiting.   Endocrine: Negative for cold intolerance.    Genitourinary: Negative for dysuria and frequency.   Musculoskeletal: Negative for back pain and neck pain.   Skin: Negative for pallor, rash and wound.   Neurological: Negative for dizziness, tremors, speech difficulty, weakness, light-headedness, numbness and headaches.   Psychiatric/Behavioral: Negative for agitation, behavioral problems, confusion, hallucinations, self-injury and sleep disturbance. The patient is not hyperactive.    All other systems reviewed and are negative.      Physical Exam:  BP (!) 142/90   Pulse 70   Ht 5\' 4"  (1.626 m)   Wt 222 lb (100.7 kg)   BMI 38.11 kg/m2  Physical Exam     Lungs - CTA Bilaterally  Inc - healed well.  One of the incisions was TTP with small firm region below incision.  No erythema or drainage    Diagnostic Testing:       CT done at OSH - unable to view images, but reports reviewed    Assessment:        Problem List Items Addressed This Visit     None      Spontaneous PTX, s/p Bleb resection  Plan:    Follow up for wound check in 2 weeks. Will need 3 month follow up CT to evaluate R hilar LAD    No Follow-up on file.

## 2015-08-16 ENCOUNTER — Encounter: Attending: Thoracic Surgery (Cardiothoracic Vascular Surgery) | Primary: Family Medicine

## 2015-08-23 ENCOUNTER — Ambulatory Visit
Admit: 2015-08-23 | Discharge: 2015-08-23 | Payer: PRIVATE HEALTH INSURANCE | Attending: Thoracic Surgery (Cardiothoracic Vascular Surgery) | Primary: Family Medicine

## 2015-08-23 DIAGNOSIS — J9312 Secondary spontaneous pneumothorax: Secondary | ICD-10-CM

## 2015-08-23 NOTE — Progress Notes (Signed)
Tiffany DoppSusan M Little   Oct 16, 1945   70 y.o.  female    Chief Complaint   Patient presents with   ??? Post-Op Check     Robotic Bleb Resection         HPI:  Patient is being seen in the office today for 2 week f/u post Right Robotic Bleb Resection.  Patient states constant aching present to right lung.  Patient states pain hard lump at incision site. Patient states dry cough since surgery.     Past Medical History:   Diagnosis Date   ??? Hyperlipidemia    ??? Hypertension    ??? Thyroid disease       Past Surgical History:   Procedure Laterality Date   ??? LUNG SURGERY Right 07/06/2015    Robotic right apical bleb resection     Allergies   Allergen Reactions   ??? Morphine Nausea And Vomiting     Current Outpatient Prescriptions   Medication Sig Dispense Refill   ??? levothyroxine (SYNTHROID) 100 MCG tablet Take 100 mcg by mouth daily     ??? simvastatin (ZOCOR) 20 MG tablet Take 20 mg by mouth nightly     ??? omeprazole (PRILOSEC) 20 MG delayed release capsule Take 20 mg by mouth daily     ??? triamterene-hydrochlorothiazide (DYAZIDE) 37.5-25 MG per capsule Take 1 capsule by mouth every morning     ??? verapamil (CALAN) 80 MG tablet Take 80 mg by mouth 3 times daily     ??? atenolol (TENORMIN) 25 MG tablet Take 25 mg by mouth daily       No current facility-administered medications for this visit.       History reviewed. No pertinent family history.    Review of Systems:       Review of Systems   Constitutional: Negative for chills, fever and unexpected weight change.   HENT: Negative for nosebleeds and voice change.    Respiratory: Positive for cough. Negative for apnea, chest tightness and shortness of breath.    Cardiovascular: Negative for chest pain and palpitations.   Gastrointestinal: Negative for abdominal pain, nausea and vomiting.   Endocrine: Negative for cold intolerance.   Genitourinary: Negative for dysuria and frequency.   Musculoskeletal: Negative for back pain and neck pain.   Skin: Negative for pallor, rash and wound.    Neurological: Negative for dizziness, tremors, speech difficulty, weakness, light-headedness, numbness and headaches.   Psychiatric/Behavioral: Negative for agitation, behavioral problems, confusion, hallucinations, self-injury and sleep disturbance. The patient is not hyperactive.    All other systems reviewed and are negative.      Physical Exam:  BP 130/78   Pulse 67   Ht 5\' 4"  (1.626 m)   Wt 223 lb (101.2 kg)   BMI 38.28 kg/m2  Physical Exam     Extremities:  No edema, clubbing or cyanosis    Vascular exam:  Abdominal Aorta: Normal; Femoral Arteries: 2+ and equal; Pedal Pulses: 2+ and equal     Diagnostic Testing:       CT OSH reviewed - pt has a seroma    Assessment:        Problem List Items Addressed This Visit     None          Plan:    Inc still with seroma underneath, more defined - will obtain last CT from [facility].  Pt has LAD and will undergo EBUS at [facility], she was offered having procedure done by our office but respectfully refused  We will call her after CT is reviewed    No Follow-up on file.

## 2015-11-08 ENCOUNTER — Ambulatory Visit
Admit: 2015-11-08 | Discharge: 2015-11-08 | Payer: PRIVATE HEALTH INSURANCE | Attending: Thoracic Surgery (Cardiothoracic Vascular Surgery) | Primary: Family Medicine

## 2015-11-08 DIAGNOSIS — R591 Generalized enlarged lymph nodes: Secondary | ICD-10-CM

## 2015-11-08 NOTE — Progress Notes (Signed)
Tiffany DoppSusan M Little   27-Jun-1945   70 y.o.  female    Chief Complaint   Patient presents with   ??? Surgical Consult     EBUS         HPI:  Patient is being seen in the office today to discuss EBUS.  Patient states pain with inspiration since surgery in May however this has been improving.  She initially was evaluated [facility] with repeat CT one month follow up which showed persistent LAD.  EBUS was recommended however the patient presents for a second opinion.  She has no cough or sputum production.  No history of cancer or sick contacts    She denies severe SOB.  She does have retrosternal CP, nonpleuritic.  This pain radiates to the neck on the R side.  She had a negative stress test in April at [facility].  She states her symptoms are not related to activity.  She denies difficulty swallowing.     Past Medical History:   Diagnosis Date   ??? Hyperlipidemia    ??? Hypertension    ??? Thyroid disease       Past Surgical History:   Procedure Laterality Date   ??? LUNG SURGERY Right 07/06/2015    Robotic right apical bleb resection     Allergies   Allergen Reactions   ??? Morphine Nausea And Vomiting     Current Outpatient Prescriptions   Medication Sig Dispense Refill   ??? levothyroxine (SYNTHROID) 100 MCG tablet Take 100 mcg by mouth daily     ??? simvastatin (ZOCOR) 20 MG tablet Take 20 mg by mouth nightly     ??? omeprazole (PRILOSEC) 20 MG delayed release capsule Take 20 mg by mouth daily     ??? triamterene-hydrochlorothiazide (DYAZIDE) 37.5-25 MG per capsule Take 1 capsule by mouth every morning     ??? verapamil (CALAN) 80 MG tablet Take 80 mg by mouth 3 times daily     ??? atenolol (TENORMIN) 25 MG tablet Take 25 mg by mouth daily       No current facility-administered medications for this visit.       History reviewed. No pertinent family history.    Review of Systems:       Review of Systems   Constitutional: Negative for chills, fever and unexpected weight change.   HENT: Negative for nosebleeds and voice  change.    Respiratory: Negative for apnea, chest tightness and shortness of breath.    Cardiovascular: Negative for chest pain and palpitations.   Gastrointestinal: Negative for abdominal pain, nausea and vomiting.   Endocrine: Negative for cold intolerance.   Genitourinary: Negative for dysuria and frequency.   Musculoskeletal: Negative for back pain and neck pain.   Skin: Negative for pallor, rash and wound.   Neurological: Negative for dizziness, tremors, speech difficulty, weakness, light-headedness, numbness and headaches.   Psychiatric/Behavioral: Negative for agitation, behavioral problems, confusion, hallucinations, self-injury and sleep disturbance. The patient is not hyperactive.    All other systems reviewed and are negative.      Physical Exam:  BP 128/84   Pulse 75   Ht 5\' 5"  (1.651 m)   Wt 212 lb (96.2 kg)   SpO2 94%   BMI 35.28 kg/m2  Physical Exam   CONSTITUTIONAL:  awake, alert, cooperative, no apparent distress, and appears stated age  NECK:  Supple, symmetrical, trachea midline, no adenopathy, thyroid symmetric, not enlarged and no tenderness, skin normal  HEMATOLOGIC/LYMPHATICS:  no cervical  lymphadenopathy and no supraclavicular lymphadenopathy  LUNGS:  No increased work of breathing, good air exchange, clear to auscultation bilaterally, no crackles or wheezing  CARDIOVASCULAR:  Normal apical impulse, regular rate and rhythm, normal S1 and S2, no S3 or S4, and no murmur noted  CHEST/BREASTS:  symmetric  ABDOMEN: soft nt nd, no pulsitile masses  MUSCULOSKELETAL:  There is no redness, warmth, or swelling of the joints.  Full range of motion noted.  Motor strength is 5 out of 5 all extremities bilaterally.  Tone is normal.  NEUROLOGIC:  Awake, alert, oriented to name, place and time.  Cranial nerves II-XII are grossly intact.  Motor is 5 out of 5 bilaterally.     SKIN:  no bruising or bleeding and normal skin color, texture, turgor  VASC: 1+ femoral pulses      Diagnostic Testing:       Previous  CTs reviewed along with stress test reviewed    Assessment:        Problem List Items Addressed This Visit     Lymphadenopathy    Relevant Orders    CT chest pulmonary embolism with contrast          Plan:    Repeat CT chest with IV contrast  May need cardiology work up due to chest pressure with negative stress test.  I discussed her case with her primary care MD who would like to see her in the office prior to cardiology evaluation.  Follow up after CT to discuss if EBUS is needed.    Return for after CT performed.

## 2015-11-14 ENCOUNTER — Encounter

## 2015-11-14 ENCOUNTER — Inpatient Hospital Stay: Admit: 2015-11-14 | Attending: Thoracic Surgery (Cardiothoracic Vascular Surgery) | Primary: Family Medicine

## 2015-11-14 DIAGNOSIS — R591 Generalized enlarged lymph nodes: Secondary | ICD-10-CM

## 2015-11-14 LAB — POCT CREATININE
GFR African American: >60 mL/min/{1.73_m2} (ref >60)
GFR Non-African American: 53 mL/min/{1.73_m2} — ABNORMAL LOW (ref >60)
POC Creatinine: 1 MG/DL (ref 0.6–1.1)

## 2015-11-14 MED ORDER — NORMAL SALINE FLUSH 0.9 % IV SOLN
0.9 % | Freq: Once | INTRAVENOUS | Status: AC
Start: 2015-11-14 — End: 2015-11-14

## 2015-11-14 MED ORDER — NORMAL SALINE FLUSH 0.9 % IV SOLN
0.9 | INTRAVENOUS | Status: AC
Start: 2015-11-14 — End: 2015-11-14

## 2015-11-14 MED ORDER — IOVERSOL 68 % IV SOLN
68 % | Freq: Once | INTRAVENOUS | Status: AC | PRN
Start: 2015-11-14 — End: 2015-11-14

## 2015-11-14 MED ORDER — IOVERSOL 68 % IV SOLN
68 | INTRAVENOUS | Status: AC
Start: 2015-11-14 — End: 2015-11-14

## 2015-11-14 MED FILL — OPTIRAY 320 68 % IV SOLN: 68 % | INTRAVENOUS | Qty: 100

## 2015-11-14 MED FILL — NORMAL SALINE FLUSH 0.9 % IV SOLN: 0.9 % | INTRAVENOUS | Qty: 10

## 2015-11-30 ENCOUNTER — Ambulatory Visit
Admit: 2015-11-30 | Discharge: 2015-11-30 | Payer: PRIVATE HEALTH INSURANCE | Attending: Thoracic Surgery (Cardiothoracic Vascular Surgery) | Primary: Family Medicine

## 2015-11-30 DIAGNOSIS — R590 Localized enlarged lymph nodes: Secondary | ICD-10-CM

## 2015-11-30 NOTE — Progress Notes (Signed)
Tiffany Little   06-04-1945   70 y.o.  female    Chief Complaint   Patient presents with   ??? Results     CT Chest         HPI:  Patient is being seen in the office today to discuss CT Chest results.  Patient states pain on inspiration since surgery in May.  Patient states night sweats for the past 3 weeks.  Patient states took myself off Dyazide d/t palpitations, ringing in ear, and leg cramps.  Patient states relief from symptoms after stopping.     She has been feeling well over the past few months with no recent weight loss, chills, fevers.  She has no cough, but does complain of pain with inspiration across the front of her chest.  She was seen by her PCP for evaluation of CP (stress test negative at [facility])    Past Medical History:   Diagnosis Date   ??? Hyperlipidemia    ??? Hypertension    ??? Thyroid disease       Past Surgical History:   Procedure Laterality Date   ??? LUNG SURGERY Right 07/06/2015    Robotic right apical bleb resection     Allergies   Allergen Reactions   ??? Morphine Nausea And Vomiting     Current Outpatient Prescriptions   Medication Sig Dispense Refill   ??? levothyroxine (SYNTHROID) 100 MCG tablet Take 100 mcg by mouth daily     ??? simvastatin (ZOCOR) 20 MG tablet Take 20 mg by mouth nightly     ??? omeprazole (PRILOSEC) 20 MG delayed release capsule Take 20 mg by mouth daily     ??? triamterene-hydrochlorothiazide (DYAZIDE) 37.5-25 MG per capsule Take 1 capsule by mouth every morning     ??? verapamil (CALAN) 80 MG tablet Take 80 mg by mouth 3 times daily     ??? atenolol (TENORMIN) 25 MG tablet Take 25 mg by mouth daily       No current facility-administered medications for this visit.       No family history on file.    Review of Systems:       Review of Systems   Constitutional: Negative for chills, fever and unexpected weight change.   HENT: Negative for nosebleeds and voice change.    Respiratory: Negative for apnea, chest tightness and shortness of breath.    Cardiovascular: Negative for chest  pain and palpitations.   Gastrointestinal: Negative for abdominal pain, nausea and vomiting.   Endocrine: Negative for cold intolerance.   Genitourinary: Negative for dysuria and frequency.   Musculoskeletal: Negative for back pain and neck pain.   Skin: Negative for pallor, rash and wound.   Neurological: Negative for dizziness, tremors, speech difficulty, weakness, light-headedness, numbness and headaches.   Psychiatric/Behavioral: Negative for agitation, behavioral problems, confusion, hallucinations, self-injury and sleep disturbance. The patient is not hyperactive.    All other systems reviewed and are negative.      Physical Exam:  BP (!) 148/90   Pulse 75   Ht 5\' 5"  (1.651 m)   Wt 211 lb (95.7 kg)   BMI 35.11 kg/m2  Physical Exam     CONSTITUTIONAL:  awake, alert, cooperative, no apparent distress, and appears stated age  NECK:  Supple, symmetrical, trachea midline, no adenopathy, thyroid symmetric, not enlarged and no tenderness, skin normal  HEMATOLOGIC/LYMPHATICS:  no cervical lymphadenopathy and no supraclavicular lymphadenopathy  LUNGS:  No increased work of breathing, good air exchange, clear to  auscultation bilaterally, no crackles or wheezing  CARDIOVASCULAR:  Normal apical impulse, regular rate and rhythm, normal S1 and S2, no S3 or S4, and no murmur noted  CHEST/BREASTS:  Symmetric, previous robotic incisions healed  ABDOMEN: soft nt nd, no pulsitile masses  MUSCULOSKELETAL:  There is no redness, warmth, or swelling of the joints.  Full range of motion noted.  Motor strength is 5 out of 5 all extremities bilaterally.  Tone is normal.  NEUROLOGIC:  Awake, alert, oriented to name, place and time.  Cranial nerves II-XII are grossly intact.  Motor is 5 out of 5 bilaterally.     SKIN:  no bruising or bleeding and normal skin color, texture, turgor  VASC: 1+ femoral pulses        Diagnostic Testing:       CT chest shows stable enlarged mediastinal LN when compared to 5 months prior.    Assessment:         Problem List Items Addressed This Visit     None      Visit Diagnoses     LAD (lymphadenopathy), mediastinal    -  Primary          Plan:    We again discussed the options of EBUS and biopsy versus watchful waiting as there has been no appreciiable change in size.  She would like to continue to follow this closely.    Consult Dr. Allena NapoleonBashir for COPD/SOB - per patient request  PET scan to evaluate mediastinal LAD in December.  CT has shown stable medastinal LAD for 6 months.    Return for review of PET scan.

## 2016-02-15 ENCOUNTER — Inpatient Hospital Stay: Admit: 2016-02-15 | Attending: Thoracic Surgery (Cardiothoracic Vascular Surgery) | Primary: Family Medicine

## 2016-02-15 ENCOUNTER — Encounter

## 2016-02-15 DIAGNOSIS — R591 Generalized enlarged lymph nodes: Secondary | ICD-10-CM

## 2016-02-19 ENCOUNTER — Ambulatory Visit
Admit: 2016-02-19 | Discharge: 2016-02-19 | Payer: PRIVATE HEALTH INSURANCE | Attending: Thoracic Surgery (Cardiothoracic Vascular Surgery) | Primary: Family Medicine

## 2016-02-19 DIAGNOSIS — R591 Generalized enlarged lymph nodes: Secondary | ICD-10-CM

## 2016-02-19 NOTE — Progress Notes (Signed)
Tiffany M GunterPaschal Dopp   March 16, 1945   70 y.o.  female    Chief Complaint   Patient presents with   ??? Results     PET CT         HPI:  Patient is being seen in the office today to discuss PET CT done for 3 month f/u mediastinal lymphadenopathy.  Patient states recent stress d/t upcoming holidays and abd pain.    She denies chest pain or SOB.  The patient may be under stress, but otherwise has no complains    She has no fevers, cough, or SOB.    Pain from previous robotic operation has improved.    Past Medical History:   Diagnosis Date   ??? Hyperlipidemia    ??? Hypertension    ??? Thyroid disease       Past Surgical History:   Procedure Laterality Date   ??? LUNG SURGERY Right 07/06/2015    Robotic right apical bleb resection     Allergies   Allergen Reactions   ??? Morphine Nausea And Vomiting     Current Outpatient Prescriptions   Medication Sig Dispense Refill   ??? levothyroxine (SYNTHROID) 100 MCG tablet Take 100 mcg by mouth daily     ??? simvastatin (ZOCOR) 20 MG tablet Take 20 mg by mouth nightly     ??? omeprazole (PRILOSEC) 20 MG delayed release capsule Take 20 mg by mouth daily     ??? triamterene-hydrochlorothiazide (DYAZIDE) 37.5-25 MG per capsule Take 1 capsule by mouth every morning     ??? verapamil (CALAN) 80 MG tablet Take 80 mg by mouth 3 times daily     ??? atenolol (TENORMIN) 25 MG tablet Take 25 mg by mouth daily       No current facility-administered medications for this visit.       History reviewed. No pertinent family history.  Social History     Social History   ??? Marital status: Married     Spouse name: N/A   ??? Number of children: N/A   ??? Years of education: N/A     Occupational History   ??? Not on file.     Social History Main Topics   ??? Smoking status: Former Smoker   ??? Smokeless tobacco: Never Used   ??? Alcohol use No   ??? Drug use: No   ??? Sexual activity: Not on file     Other Topics Concern   ??? Not on file     Social History Narrative   ??? No narrative on file       Review of Systems:       Review of Systems   All  other systems reviewed and are negative.  10 point review of systems performed with all pertinent positives noted in HPI      Physical Exam:  BP (!) 148/90    Pulse 75    Ht 5\' 5"  (1.651 m)    Wt 212 lb (96.2 kg)    BMI 35.28 kg/m??   Physical Exam   CONSTITUTIONAL:  awake, alert, cooperative, no apparent distress, and appears stated age  NECK:  Supple, symmetrical, trachea midline, no adenopathy, thyroid symmetric, not enlarged and no tenderness, skin normal  HEMATOLOGIC/LYMPHATICS:  no cervical lymphadenopathy and no supraclavicular lymphadenopathy  LUNGS:  No increased work of breathing, good air exchange, clear to auscultation bilaterally, no crackles or wheezing  CARDIOVASCULAR:  Normal apical impulse, regular rate and rhythm, normal S1 and S2, no S3 or  S4, and no murmur noted  CHEST/BREASTS:  Symmetric, healed lateral chest incisions  ABDOMEN: soft nt nd, no pulsitile masses  MUSCULOSKELETAL:  There is no redness, warmth, or swelling of the joints.  Full range of motion noted.  Motor strength is 5 out of 5 all extremities bilaterally.  Tone is normal.  NEUROLOGIC:  Awake, alert, oriented to name, place and time.  Cranial nerves II-XII are grossly intact.  Motor is 5 out of 5 bilaterally.     SKIN:  no bruising or bleeding and normal skin color, texture, turgor  VASC: 1+ femoral pulses      Diagnostic Testing:       PET scan - R paratracheal LN remains stable and is partially calcified.  This node is 1.2 cm and has an SUV of 5.8.  Bilateral hilar LN with SUV of 3.3 and 3.1.  These LN are stable from previous CT scans    Assessment:        Problem List Items Addressed This Visit     Lymphadenopathy - Primary      Other Visit Diagnoses    None.         Plan:    1. Repeat CT chest without contrast to evaluate mediastinal LAD in 3 months.  The patient would like to continue close follow up as the LN have not changed despite slight PET avidity.  These nodes have not changed and one is partially calcified.  2. Follow  up after.    Return in about 3 months (around 05/19/2016) for review of repeat CT.

## 2016-06-28 ENCOUNTER — Encounter

## 2016-06-28 ENCOUNTER — Inpatient Hospital Stay: Admit: 2016-06-28 | Primary: Family Medicine

## 2016-06-28 DIAGNOSIS — Z1231 Encounter for screening mammogram for malignant neoplasm of breast: Secondary | ICD-10-CM

## 2016-06-28 LAB — CBC
Hematocrit: 43 % (ref 37–47)
Hemoglobin: 14.4 GM/DL (ref 12.5–16.0)
MCH: 31.6 PG — ABNORMAL HIGH (ref 27–31)
MCHC: 33.5 % (ref 32.0–36.0)
MCV: 94.3 FL (ref 78–100)
MPV: 10.8 FL (ref 7.5–11.1)
Platelets: 252 10*3/uL (ref 140–440)
RBC: 4.56 10*6/uL (ref 4.2–5.4)
RDW: 12.7 % (ref 11.7–14.9)
WBC: 5.7 10*3/uL (ref 4.0–10.5)

## 2016-06-28 LAB — COMPREHENSIVE METABOLIC PANEL
ALT: 18 U/L (ref 10–40)
AST: 18 IU/L (ref 15–37)
Albumin: 4.1 GM/DL (ref 3.4–5.0)
Alkaline Phosphatase: 76 IU/L (ref 40–128)
Anion Gap: 16 (ref 4–16)
BUN: 11 MG/DL (ref 6–23)
CO2: 23 MMOL/L (ref 21–32)
Calcium: 9.6 MG/DL (ref 8.3–10.6)
Chloride: 103 mMol/L (ref 99–110)
Creatinine: 0.8 MG/DL (ref 0.6–1.1)
GFR African American: >60 mL/min/{1.73_m2} (ref >60)
GFR Non-African American: >60 mL/min/{1.73_m2} (ref >60)
Glucose: 100 MG/DL — ABNORMAL HIGH (ref 70–99)
Potassium: 4.5 MMOL/L (ref 3.5–5.1)
Sodium: 142 MMOL/L (ref 135–145)
Total Bilirubin: 0.7 MG/DL (ref 0.0–1.0)
Total Protein: 6.9 GM/DL (ref 6.4–8.2)

## 2016-06-28 LAB — LIPID PANEL
Cholesterol: 168 MG/DL (ref <200)
HDL: 57 MG/DL (ref >40)
LDL Direct: 107 MG/DL — ABNORMAL HIGH (ref <100)
Triglycerides: 89 MG/DL (ref <150)

## 2016-06-28 LAB — T4, FREE: T4 Free: 1.43 NG/DL (ref 0.9–1.8)

## 2016-06-28 LAB — HEMOGLOBIN A1C
Hemoglobin A1C: 5.8 % (ref 4.2–6.3)
eAG: 120 mg/dL

## 2016-06-28 LAB — TSH: TSH, High Sensitivity: 0.227 u[IU]/mL — ABNORMAL LOW (ref 0.270–4.20)

## 2016-07-31 ENCOUNTER — Ambulatory Visit
Admit: 2016-07-31 | Discharge: 2016-07-31 | Payer: PRIVATE HEALTH INSURANCE | Attending: Family Medicine | Primary: Family Medicine

## 2016-07-31 DIAGNOSIS — Z8709 Personal history of other diseases of the respiratory system: Secondary | ICD-10-CM

## 2016-07-31 MED ORDER — VALSARTAN 80 MG PO TABS
80 MG | ORAL_TABLET | Freq: Every day | ORAL | 3 refills | Status: DC
Start: 2016-07-31 — End: 2016-11-15

## 2016-07-31 MED ORDER — DEXAMETHASONE SODIUM PHOSPHATE 4 MG/ML IJ SOLN
4 MG/ML | Freq: Once | INTRAMUSCULAR | Status: AC
Start: 2016-07-31 — End: 2016-07-31
  Administered 2016-07-31: 20:00:00 8 mg via INTRAMUSCULAR

## 2016-07-31 NOTE — Progress Notes (Signed)
Chief Complaint   Patient presents with   . Establish Care     Patient is here to establish care    . Leg Swelling     Patient is having issues with ankle and bilateral leg and feet swelling left is worse    . Hyperglycemia     Patient last A1C was 5.8--no medications just hyperglycemia   . Cough     Patient concerned with cough and is asking for a new pulmonologist   . Hypertension     Controlled today on current dual therapy for hypertension   . Hypothyroidism     On 100 g medication will need to assess for need for lab monitoring   . Hyperlipidemia     Patient on Lipitor with recent labs from April looking fine LDL at 107       HOPI Narrative:    This 71 year old lady presents today to establish.  She has multiple medical problems and several ongoing issues as well.    Remain on her list is respiratory problems.  She complains of chronic cough and has had this even to paroxysmal fits in the last 2 years.  She has no history of asthma.  She is remotely a smoker but not recently.  Her mother and sister have a history of emphysema.  She developed chest pain and ultimately a pneumothorax last year, she had multiple problems and eventually underwent surgery to remove apical bleb by Dr. Jerrell Belfast in May 2017.  She is recommended to see a pulmonologist and apparently referral was made to a local pulmonologist and she has never gotten a call from them or had an appointment.  She would like to be referred again today.  She has minimal respiratory symptoms.  She is also told she had enlarged lymph nodes and she should have a follow-up lungs CTA.  Constitutional symptoms include fatigue and shortness of breath with activity.  She has pedal edema bilaterally and this might be related to her higher dose of calcium channel blocker.  This medication is old and was originally prescribed for migraine suppression.  Her migraines have disappeared since a head injury several years ago.    She reports significant psychosocial  stressors.  Her Husband passed away less than a month after her surgery, reports he had multiple emboli relating to a work injury.  She has had significant anxiety and depression in the time since his passing.  She was very depressed over the winter and even had suicidal thoughts.  These are presently improved although she is failed two oral anti-depressant medications.  He is presently seen at [facility] in [place] by a therapist named Tiffany Little and by a nurse practitioner named Tiffany Little who is most recently given her Paxil which she quit due to nausea and lack of improvement and sleep problems.  She had Celexa previously which was not beneficial as well.    Review of systems is also significant for short episodes of dizziness at night lasting a few seconds and some ear problems consistent with muffling of sounds.  She also has hyperlipidemia, hypothyroidism, hypertension, hyperglycemia.  She is on multiple medications for several of these problems and those medications are listed below.  These last for [facility] medical problems are stable presently and no problems with current medication.    Patient is NEW to this provider today.  Above chief complaint and HOPI obtained by this physician provider.     Review of Systems   Constitutional: Negative for  fever, malaise/fatigue and weight loss.   HENT: Negative for congestion and ear pain.    Eyes: Negative for blurred vision, double vision and pain.   Respiratory: Positive for cough and shortness of breath. Negative for hemoptysis, sputum production, wheezing and stridor.    Cardiovascular: Negative for chest pain, palpitations and leg swelling.   Gastrointestinal: Negative for abdominal pain, blood in stool, constipation, diarrhea and heartburn.   Genitourinary: Negative for dysuria and urgency.   Musculoskeletal: Negative for back pain, joint pain and myalgias.   Skin: Negative for rash.   Neurological: Negative for sensory change, speech change, focal weakness, seizures,  weakness and headaches.   Endo/Heme/Allergies: Negative for environmental allergies. Does not bruise/bleed easily.   Psychiatric/Behavioral: Positive for depression (Depression is improved from previous). Negative for memory loss and substance abuse. The patient is nervous/anxious (With improved from previous severity but not under good control) and has insomnia.        Allergies   Allergen Reactions   . Morphine Nausea And Vomiting     Allergy history updated.    Outpatient Prescriptions Marked as Taking for the 07/31/16 encounter (Office Visit) with Tiffany NortonPenny Renesme Kerrigan, MD   Medication Sig Dispense Refill   . atorvastatin (LIPITOR) 40 MG tablet Take 40 mg by mouth daily     . Cetirizine HCl (ZYRTEC ALLERGY PO) Take 1 tablet by mouth as needed     . fluticasone (FLONASE) 50 MCG/ACT nasal spray 1 spray by Nasal route as needed for Rhinitis     . DOXYLAMINE-DM PO Take 1 tablet by mouth nightly     . valsartan (DIOVAN) 80 MG tablet Take 1 tablet by mouth daily 30 tablet 3   . verapamil (CALAN) 80 MG tablet Take 1 tablet by mouth daily 90 tablet    . levothyroxine (SYNTHROID) 100 MCG tablet Take 100 mcg by mouth daily     . omeprazole (PRILOSEC) 20 MG delayed release capsule Take 20 mg by mouth daily     . triamterene-hydrochlorothiazide (DYAZIDE) 37.5-25 MG per capsule Take 1 capsule by mouth as needed      . atenolol (TENORMIN) 25 MG tablet Take 25 mg by mouth daily         Medication list reviewed and reconciled by this provider.      Tobacco use: Remote former smoker    Past Medical History:   Diagnosis Date   . Hyperlipidemia    . Hypertension    . Thyroid disease      Past Surgical History:   Procedure Laterality Date   . BACK SURGERY  1990    low back    . BREAST BIOPSY      1970's or 80's   . LUNG SURGERY Right 07/06/2015    Robotic right apical bleb resection   . NECK SURGERY  1992     Family History   Problem Relation Age of Onset   . Cancer Father      pancreatic cancer    . Diabetes Sister    . Thyroid Disease  Sister      two sister    . Diabetes Brother      two brothers     Social History     Social History   . Marital status: Widowed     Spouse name: N/A   . Number of children: N/A   . Years of education: N/A     Social History Main Topics   . Smoking status: Former Smoker  Types: Cigarettes   . Smokeless tobacco: Never Used   . Alcohol use No   . Drug use: No   . Sexual activity: Not Asked     Other Topics Concern   . None     Social History Narrative   . None       Nursing note reviewed.    Vitals:    07/31/16 1403   BP: 118/80   Site: Left Arm   Position: Sitting   Cuff Size: Large Adult   Pulse: 66   SpO2: 96%   Height: 5\' 5"  (1.651 m)       BP Readings from Last 3 Encounters:   07/31/16 118/80   02/19/16 (!) 148/90   11/30/15 (!) 148/90     Wt Readings from Last 3 Encounters:   02/19/16 212 lb (96.2 kg)   11/30/15 211 lb (95.7 kg)   11/08/15 212 lb (96.2 kg)     There is no height or weight on file to calculate BMI.    No results found for this visit on 07/31/16.        Physical Exam   Constitutional: She is oriented to person, place, and time. She appears well-developed and well-nourished. No distress.   HENT:   Head: Normocephalic and atraumatic.   Right Ear: External ear normal.   Left Ear: External ear normal.   Mouth/Throat: Oropharynx is clear and moist.   Eyes: Conjunctivae are normal. Pupils are equal, round, and reactive to light. Right eye exhibits no discharge. Left eye exhibits no discharge.   Neck: Normal range of motion. Neck supple. No thyromegaly present.   Cardiovascular: Normal rate, regular rhythm and normal heart sounds.  Exam reveals no gallop.    No murmur heard.  Pulmonary/Chest: Effort normal and breath sounds normal. No respiratory distress. She has no wheezes. She has no rales.   Abdominal: Soft. Bowel sounds are normal. She exhibits no distension.   Musculoskeletal: She exhibits no edema.   Lymphadenopathy:     She has no cervical adenopathy.   Neurological: She is alert and oriented  to person, place, and time.   Skin: Skin is warm and dry. No rash noted. She is not diaphoretic. No erythema.   Psychiatric: She has a normal mood and affect. Her behavior is normal.   Nursing note and vitals reviewed.      Intake paperwork reviewed in detail and scanned to chart.     Controlled Substances Monitoring:     The Prescription Monitoring Report for this patient was reviewed today. (Tramadol from surgeons this spring) Tiffany Norton, MD)                    _________________________________________________  Assessment:     Tiffany Little was seen today for establish care, leg swelling, hyperglycemia, cough, hypertension, hypothyroidism and hyperlipidemia.    Diagnoses and all orders for this visit:    Hx of pneumothorax  -     [facility] Pulmonology- Georjean Mode, MD    DOE (dyspnea on exertion)  [facility] Pulmonology- Georjean Mode, MD    Lymphadenopathy  -     [facility] Pulmonology- Georjean Mode, MD    Vertigo  -     dexamethasone (DECADRON) injection 8 mg; Inject 2 mLs into the muscle once    Essential hypertension  -     valsartan (DIOVAN) 80 MG tablet; Take 1 tablet by mouth daily    Pedal edema    Acute  seasonal allergic rhinitis due to pollen    Mixed hyperlipidemia        _________________________________________________  Plan:     Her respiratory symptoms without evidence of prior obstructive airway disease we'll refer her to pulmonology.  A referral was made to Dr. Lou Miner.  She will likely need pulmonary function testing and probably repeat CAT scan imaging.  I did not give her medication.    Due to ear problems and vertigo symptoms as trial of Decadron would be reasonable.  An injection was given today.  She declined to go on oral allergy medication but she would consider retrying Flonase that she was on previously.    Essential hypertension, diabetes, thyroid problems, lipid problems are all under good control and up to date with recent lab monitoring ordered by specialist.  This is reviewed in chart  today.    Due to edema we will decrease her verapamil 3 times a day to verapamil once a day and initiate therapy with Diovan 80 mg once a day.  We may need to further adjust her antihypertensive therapy but will start with these steps.    Return in about 3 weeks (around 08/21/2016), or if symptoms worsen or fail to improve, for PRESCHEDULE, recheck edema, recheck on new med.    Encounter note is signed electronically at date and time of note closure.

## 2016-07-31 NOTE — Patient Instructions (Signed)
Decrease verapamil to 80 mg once a day and add new B P med which is valsartan 80 mg once a day.  We wil adjust further at recheck.

## 2016-08-05 ENCOUNTER — Institutional Professional Consult (permissible substitution)
Admit: 2016-08-05 | Discharge: 2016-08-05 | Payer: PRIVATE HEALTH INSURANCE | Attending: Critical Care Medicine | Primary: Family Medicine

## 2016-08-05 DIAGNOSIS — Z87891 Personal history of nicotine dependence: Secondary | ICD-10-CM

## 2016-08-05 NOTE — Assessment & Plan Note (Signed)
She has all the symptoms that are consistent with OSA  Advised to go for the sleep study   She is seeing Dr. Beryl Meagerhandha

## 2016-08-05 NOTE — Assessment & Plan Note (Signed)
Loose weight with diet and exercise

## 2016-08-05 NOTE — Assessment & Plan Note (Signed)
PFT's  c/w quitting smoking  Loose weight

## 2016-08-05 NOTE — Progress Notes (Signed)
Tiffany Little  September 28, 1945      Subjective:     Chief Complaint   Patient presents with   . Shortness of Breath       HPI  Tiffany Little is a 71 y.o. female presenting with the c/o cough for about 6 weeks. It's a dry cough, no hemoptysis,  loss of weight. Of 22 lbs since May'17.She has SOB on exertion. She can walk for about 1 mile and can climb 3 flights of stairs. She had a right spontaneous pneumothorax in May'17. She had a right robotic bleb resection. She had a chest tube placed at that time. She had a CT chest x 2 for the mediastinal LN and it is suspected to be reactive. She has a h/o smoking 2 pks per day for about 18 years, which she quit in 1982. She has not gained any weight in the last 10 years. She had flu vaccine about 2 years ago and pneumovax in 2016.    She is awaiting to see Dr. Lanny Little for the sleep study.    Current Outpatient Prescriptions   Medication Sig Dispense Refill   . atorvastatin (LIPITOR) 40 MG tablet Take 40 mg by mouth daily     . Cetirizine HCl (ZYRTEC ALLERGY PO) Take 1 tablet by mouth as needed     . fluticasone (FLONASE) 50 MCG/ACT nasal spray 1 spray by Nasal route as needed for Rhinitis     . DOXYLAMINE-DM PO Take 1 tablet by mouth nightly     . valsartan (DIOVAN) 80 MG tablet Take 1 tablet by mouth daily 30 tablet 3   . verapamil (CALAN) 80 MG tablet Take 1 tablet by mouth daily 90 tablet    . levothyroxine (SYNTHROID) 100 MCG tablet Take 100 mcg by mouth daily     . omeprazole (PRILOSEC) 20 MG delayed release capsule Take 20 mg by mouth daily     . triamterene-hydrochlorothiazide (DYAZIDE) 37.5-25 MG per capsule Take 1 capsule by mouth as needed      . atenolol (TENORMIN) 25 MG tablet Take 25 mg by mouth daily       No current facility-administered medications for this visit.        Allergies   Allergen Reactions   . Morphine Nausea And Vomiting       Past Medical History:   Diagnosis Date   . Hyperlipidemia    . Hypertension    . Thyroid disease        Past Surgical History:    Procedure Laterality Date   . BACK SURGERY  1990    low back    . BREAST BIOPSY      1970's or 80's   . LUNG SURGERY Right 07/06/2015    Robotic right apical bleb resection   . NECK SURGERY  1992       Social History     Social History   . Marital status: Widowed     Spouse name: N/A   . Number of children: N/A   . Years of education: N/A     Social History Main Topics   . Smoking status: Former Smoker     Types: Cigarettes   . Smokeless tobacco: Never Used   . Alcohol use No   . Drug use: No   . Sexual activity: Not Asked     Other Topics Concern   . None     Social History Narrative   . None  Review of Systems   Constitutional: Negative.    HENT: Negative.    Eyes: Negative.    Respiratory: Positive for cough.    Cardiovascular: Negative.    Gastrointestinal: Negative.    Genitourinary: Negative.    Musculoskeletal: Negative.    Skin: Negative.    Neurological: Negative.    Endo/Heme/Allergies: Negative.    Psychiatric/Behavioral: Negative.        Objective:     Vitals:    08/05/16 1331   BP: 114/88   Pulse: 66   SpO2: 97%     Body mass index is 34.61 kg/m.  Sleep Medicine 08/05/2016   Sitting and reading 0   Watching TV 0   Sitting, inactive in a public place (e.g. a theatre or a meeting) 0   As a passenger in a car for an hour without a break 0   Lying down to rest in the afternoon when circumstances permit 1   Sitting and talking to someone 0   Sitting quietly after a lunch without alcohol 0   In a car, while stopped for a few minutes in traffic 0   Total score 1   Neck circumference 16     Mallampati 1    Physical Exam   Constitutional: She is oriented to person, place, and time. She appears well-developed and well-nourished.   Obese   HENT:   Head: Normocephalic and atraumatic.   Overjet present   Eyes: Conjunctivae are normal. Pupils are equal, round, and reactive to light.   Neck: Normal range of motion. Neck supple.   Cardiovascular: Normal rate, regular rhythm and normal heart sounds.     Pulmonary/Chest: Effort normal and breath sounds normal.   Abdominal: Soft. Bowel sounds are normal.   Musculoskeletal: Normal range of motion.   Neurological: She is alert and oriented to person, place, and time.   Skin: Skin is warm and dry.   Vitals reviewed.      Radiology:     Assessment and Plan     Problem List     Ex-cigarette smoker     Advised to c/w quitting smoking         COPD (chronic obstructive pulmonary disease) (HCC)     c/w quitting smoking  PFT's  Get yearly flu vaccine           Relevant Medications    Cetirizine HCl (ZYRTEC ALLERGY PO)    fluticasone (FLONASE) 50 MCG/ACT nasal spray    DOXYLAMINE-DM PO    dexamethasone (DECADRON) injection 8 mg (Completed)    Other Relevant Orders    FULL PFT STUDY WITH PRE AND POST    Dyspnea on exertion     PFT's  c/w quitting smoking  Loose weight         Relevant Orders    FULL PFT STUDY WITH PRE AND POST    Obesity (BMI 30-39.9)     Loose weight with diet and exercise           OSA (obstructive sleep apnea)     She has all the symptoms that are consistent with OSA  Advised to go for the sleep study   She is seeing Dr. Beryl Little                    Return in about 2 weeks (around 08/19/2016) for PFT.      Ardelle LeschesShakeel A Presley Gora MD  08/05/2016  1:59 PM

## 2016-08-05 NOTE — Assessment & Plan Note (Signed)
c/w quitting smoking  PFT's  Get yearly flu vaccine

## 2016-08-05 NOTE — Assessment & Plan Note (Signed)
Advised to c/w quitting smoking

## 2016-08-09 ENCOUNTER — Inpatient Hospital Stay: Admit: 2016-08-09 | Attending: Thoracic Surgery (Cardiothoracic Vascular Surgery) | Primary: Family Medicine

## 2016-08-09 ENCOUNTER — Encounter

## 2016-08-09 DIAGNOSIS — R591 Generalized enlarged lymph nodes: Secondary | ICD-10-CM

## 2016-08-12 ENCOUNTER — Ambulatory Visit
Admit: 2016-08-12 | Discharge: 2016-08-12 | Payer: PRIVATE HEALTH INSURANCE | Attending: Thoracic Surgery (Cardiothoracic Vascular Surgery) | Primary: Family Medicine

## 2016-08-12 DIAGNOSIS — R591 Generalized enlarged lymph nodes: Secondary | ICD-10-CM

## 2016-08-12 NOTE — Progress Notes (Signed)
Tiffany Little   [birth year]   71 y.o.  female    Chief Complaint   Patient presents with   . Results     CT Chest         HPI:  Patient is being seen in the office today to discuss CT Chest results done 08/09/16.     The patient has no symptoms.  She is feeling well.  She has no cough.  No chest pains or shortness of breath.    Since her last visit she has seen a new pulmonologist who also reviewed her CT scan.    She is in relatively good spirits.  Past Medical History:   Diagnosis Date   . Hyperlipidemia    . Hypertension    . Thyroid disease       Past Surgical History:   Procedure Laterality Date   . BACK SURGERY  1990    low back    . BREAST BIOPSY      1970's or 80's   . LUNG SURGERY Right 07/06/2015    Robotic right apical bleb resection   . NECK SURGERY  1992     Allergies   Allergen Reactions   . Morphine Nausea And Vomiting     Current Outpatient Prescriptions   Medication Sig Dispense Refill   . atorvastatin (LIPITOR) 40 MG tablet Take 40 mg by mouth daily     . Cetirizine HCl (ZYRTEC ALLERGY PO) Take 1 tablet by mouth as needed     . fluticasone (FLONASE) 50 MCG/ACT nasal spray 1 spray by Nasal route as needed for Rhinitis     . DOXYLAMINE-DM PO Take 1 tablet by mouth nightly     . valsartan (DIOVAN) 80 MG tablet Take 1 tablet by mouth daily 30 tablet 3   . verapamil (CALAN) 80 MG tablet Take 1 tablet by mouth daily 90 tablet    . levothyroxine (SYNTHROID) 100 MCG tablet Take 100 mcg by mouth daily     . omeprazole (PRILOSEC) 20 MG delayed release capsule Take 20 mg by mouth daily     . triamterene-hydrochlorothiazide (DYAZIDE) 37.5-25 MG per capsule Take 1 capsule by mouth as needed      . atenolol (TENORMIN) 25 MG tablet Take 25 mg by mouth daily       No current facility-administered medications for this visit.       Family History   Problem Relation Age of Onset   . Cancer Father      pancreatic cancer    . Diabetes Sister    . Thyroid Disease Sister      two sister    . Diabetes Brother      two  brothers     Social History     Social History   . Marital status: Widowed     Spouse name: N/A   . Number of children: N/A   . Years of education: N/A     Occupational History   . Not on file.     Social History Main Topics   . Smoking status: Former Smoker     Types: Cigarettes   . Smokeless tobacco: Never Used   . Alcohol use No   . Drug use: No   . Sexual activity: Not on file     Other Topics Concern   . Not on file     Social History Narrative   . No narrative on file  Review of Systems:       Review of Systems   All other systems reviewed and are negative.  10 point review of systems performed with all pertinent positives noted in HPI      Physical Exam:  BP 126/82   Pulse 85   Ht 5\' 5"  (1.651 m)   Wt 210 lb (95.3 kg)   BMI 34.95 kg/m   Physical Exam     CONSTITUTIONAL:  awake, alert, cooperative, no apparent distress, and appears stated age  NECK:  Supple, symmetrical, trachea midline, no adenopathy, thyroid symmetric, not enlarged and no tenderness, skin normal  HEMATOLOGIC/LYMPHATICS:  no cervical lymphadenopathy and no supraclavicular lymphadenopathy  LUNGS:  No increased work of breathing, good air exchange, clear to auscultation bilaterally, no crackles or wheezing  CARDIOVASCULAR:  Normal apical impulse, regular rate and rhythm, normal S1 and S2, no S3 or S4, and no murmur noted  CHEST/BREASTS:  symmetric  ABDOMEN: soft nt nd, no pulsitile masses  MUSCULOSKELETAL:  There is no redness, warmth, or swelling of the joints.  Full range of motion noted.  Motor strength is 5 out of 5 all extremities bilaterally.  Tone is normal.  NEUROLOGIC:  Awake, alert, oriented to name, place and time.  Cranial nerves II-XII are grossly intact.  Motor is 5 out of 5 bilaterally.     SKIN:  no bruising or bleeding and normal skin color, texture, turgor  VASC: 1+ femoral pulses      Diagnostic Testing:       CT scan reviewed, lymphadenopathy is stable.  This was previously    PET avid however we have repeated  multiple CT scans over the past year with no change in these lymph nodes    Assessment:        Problem List Items Addressed This Visit     Lymphadenopathy - Primary          Plan:      I do believe that this lymphadenopathy is most likely not pathologic as it has been stable for the past month.  We will continue to follow this and the patient's symptoms.  I do not think that a CT is warranted for another year.  The patient agrees with this plan as there has been no change over the past year.  If she has any symptoms of chest pressure shortness of breath fevers night sweats, she will present to see us for an earlier CT scan of the chest.    CT chest in 1 year to eval LAD    Return in about 1 year (around 08/12/2017) for Review imaging.

## 2016-08-26 ENCOUNTER — Ambulatory Visit
Admit: 2016-08-26 | Discharge: 2016-08-26 | Payer: PRIVATE HEALTH INSURANCE | Attending: Family Medicine | Primary: Family Medicine

## 2016-08-26 DIAGNOSIS — F411 Generalized anxiety disorder: Secondary | ICD-10-CM

## 2016-08-26 MED ORDER — LORAZEPAM 0.5 MG PO TABS
0.5 MG | ORAL_TABLET | Freq: Three times a day (TID) | ORAL | 0 refills | Status: DC | PRN
Start: 2016-08-26 — End: 2017-02-06

## 2016-08-26 MED ORDER — DESVENLAFAXINE SUCCINATE ER 25 MG PO TB24
25 MG | ORAL_TABLET | Freq: Every day | ORAL | 1 refills | Status: DC
Start: 2016-08-26 — End: 2017-03-03

## 2016-08-26 NOTE — Progress Notes (Signed)
Chief Complaint   Patient presents with   ??? Edema     Patient is here for a recheck on edema and new medication    ??? Anxiety     needs new medicine, anxiety is worse has had multiple external stressors and anticipate [place] that family coming to visit will make her feel worse   ??? Hypertension     Excellent control on current medication with no problems on current regimen       HOPI NARRATIVE:    Patient reports that edema today is completely resolved.  We had reduced her verapamil from 3 times a day to once a day, and started Diovan 80. Has quit verapamil completely.  No problems with Diovan.    She reports new onset of whole body itching, skin crawling like she took a migraine med, started about a week or ten days ago.  No rash just itching.  No new medication except Diovan and I've never heard of itching with it before.    She also has a lot worse anxiety and states her hydroxyzine is not working and slammed that pill bottle down on the table and then states the Paxil she was on before also was no benefit.  When asked to specify she does admit to the hydroxyzine making her feel unwell.    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  She does appear to have xerosis but no rashes seen.  She does also complain of very large breasts and actually falling while working on a ladder several times and being unbalanced due to pendulous breasts.  She is interested in breast reduction and would like to see if she can be considered for this.    Patient is established unless otherwise noted.  Above chief complaint and HOPI obtained by this physician provider.     Review of Systems   Constitutional: Negative for fever and malaise/fatigue.   HENT: Negative for congestion.    Eyes: Negative for blurred vision.   Respiratory: Negative for cough.    Cardiovascular: Negative for chest pain and leg swelling (Edema is completely resolved).   Gastrointestinal: Negative for abdominal  pain.   Musculoskeletal: Negative for myalgias.   Skin: Positive for itching (Itching on many areas of her body in the skin without rash). Negative for rash.   Neurological: Negative for headaches.   Psychiatric/Behavioral: Positive for depression (Says she is not depressed but then admits she is weepy all the time). The patient is nervous/anxious.        Allergies   Allergen Reactions   ??? Morphine Nausea And Vomiting     Allergy history updated.    Outpatient Prescriptions Marked as Taking for the 08/26/16 encounter (Office Visit) with Merita Norton, MD   Medication Sig Dispense Refill   ??? desvenlafaxine succinate (PRISTIQ) 25 MG TB24 extended release tablet Take 1 tablet by mouth daily 90 tablet 1   ??? LORazepam (ATIVAN) 0.5 MG tablet Take 1 tablet by mouth every 8 hours as needed for Anxiety ((severe)) for up to 30 days.. 10 tablet 0   ??? atorvastatin (LIPITOR) 40 MG tablet Take 40 mg by mouth daily     ??? Cetirizine HCl (ZYRTEC ALLERGY PO) Take 1 tablet by mouth as needed     ??? fluticasone (FLONASE) 50 MCG/ACT nasal spray 1 spray by Nasal route as needed for Rhinitis     ??? DOXYLAMINE-DM PO Take 1 tablet by mouth nightly     ???  valsartan (DIOVAN) 80 MG tablet Take 1 tablet by mouth daily 30 tablet 3   ??? levothyroxine (SYNTHROID) 100 MCG tablet Take 100 mcg by mouth daily     ??? omeprazole (PRILOSEC) 20 MG delayed release capsule Take 20 mg by mouth daily     ??? atenolol (TENORMIN) 25 MG tablet Take 25 mg by mouth daily         Tobacco use history updated.   History   Smoking Status   ??? Former Smoker   ??? Types: Cigarettes   Smokeless Tobacco   ??? Never Used        Nursing note reviewed.    Vitals:    08/26/16 1125   BP: 126/84   Site: Left Arm   Position: Sitting   Cuff Size: Large Adult   Pulse: 66   SpO2: 98%   Weight: 210 lb 9.6 oz (95.5 kg)   Height: 5\' 5"  (1.651 m)          BP Readings from Last 3 Encounters:   08/26/16 126/84   08/12/16 126/82   08/05/16 114/88     Wt Readings from Last 3 Encounters:   08/26/16 210 lb  9.6 oz (95.5 kg)   08/12/16 210 lb (95.3 kg)   08/05/16 208 lb (94.3 kg)     Body mass index is 35.05 kg/m??.    No results found for this visit on 08/26/16.      Physical Exam   Constitutional: She is oriented to person, place, and time. She appears well-developed and well-nourished. No distress.   HENT:   Head: Normocephalic.   Mouth/Throat: Oropharynx is clear and moist.   Eyes: Conjunctivae are normal. Pupils are equal, round, and reactive to light. Right eye exhibits no discharge. Left eye exhibits no discharge.   Neck: Normal range of motion.   Cardiovascular: Normal rate, regular rhythm and normal heart sounds.    No murmur heard.  Pulmonary/Chest: Effort normal and breath sounds normal. No respiratory distress. She has no wheezes. She has no rales.   Large pendulous breasts with grooving in shoulders from bra straps.  She does have some tenderness to the upper back palpation related to chronic muscle spasm and strain.   Neurological: She is alert and oriented to person, place, and time.   Skin: Skin is warm and dry. No rash (Xerosis but no rash) noted. She is not diaphoretic.   Psychiatric: She has a normal mood and affect. Her behavior is normal.   Nursing note and vitals reviewed.      Controlled Substances Monitoring:     RX Monitoring 08/26/2016   Attestation The Prescription Monitoring Report for this patient was reviewed today.         _________________________________________________  Assessment:     Lesia was seen today for edema, anxiety and hypertension.    Diagnoses and all orders for this visit:    GAD (generalized anxiety disorder)  -     desvenlafaxine succinate (PRISTIQ) 25 MG TB24 extended release tablet; Take 1 tablet by mouth daily    Depressive disorder  -     desvenlafaxine succinate (PRISTIQ) 25 MG TB24 extended release tablet; Take 1 tablet by mouth daily    Essential hypertension    Pedal edema    Bilateral mastodynia  -     External Referral To Plastic Surgery    Strain of trapezius  muscle, unspecified laterality, sequela  -     External Referral To Plastic Surgery  Large breasts  -     External Referral To Plastic Surgery    Panic attacks  -     LORazepam (ATIVAN) 0.5 MG tablet; Take 1 tablet by mouth every 8 hours as needed for Anxiety ((severe)) for up to 30 days..      Problems listed above are stable and therapeutic plan is unchanged unless otherwise specified.  See orders above and comments below for details of workup or medication orders.          _________________________________________________  Plan:     To continue current medication for blood pressure as pedal edema is resolved.  And blood pressure is under excellent control.    We will try Pristiq at a tiny dose at 25 mg daily along with rare Ativan for rescue use up to 10 per month.  For anxiety with depression symptoms as well.    We'll refer her to plastic surgery for a large breasts with unbalanced and upper back pain and groups in both shoulders.  Discussed multiple options and she has had nonoptimal experiences with surgeons in the area and would like to go to [place] or [place].  We settled on Dr. Thomasenia Saleshami who works out of [place], I have experience with him personally from [facility] and could recommend him with confidence.    Return in about 3 months (around 11/26/2016), or if symptoms worsen or fail to improve, for recheck chronic medical problem, PRESCHEDULE.     She does not have good results she should call and we will Double pristiq if not working.      Electronically signed by Merita NortonPenny Jedediah Noda, MD on 08/26/16 at 11:39 AM

## 2016-08-28 ENCOUNTER — Inpatient Hospital Stay: Admit: 2016-08-28 | Primary: Family Medicine

## 2016-08-28 DIAGNOSIS — G4733 Obstructive sleep apnea (adult) (pediatric): Secondary | ICD-10-CM

## 2016-08-28 NOTE — Progress Notes (Signed)
Tiffany DoppSusan M Little, Apr 01, 1945, is scheduled for a hst at [facility] on  09/16/2016 at 1100      Electronically signed by Lynnette Caffeyheri L Danielys Madry on 08/28/2016 at 12:36 PM

## 2016-08-28 NOTE — Progress Notes (Signed)
[facility]      Evorn Gong MD, FACP, [facility]  Erby Pian MD, [place], [place]    Jacklyn Shell MD, [facility]  Georjean Mode MD, FCCP      [address]  [address]  [place], [place] [address]   PH: [phone]  F: [phone]     Subjective:     Patient ID: Tiffany Little is a 71 y.o. female, referred to the sleep center for   Chief Complaint   Patient presents with   . Sleep Apnea   .    Referring physician:  Dr Mathews Robinsons     History:     snoring, EDS for 10 yrs  Gasping in sleep      Social History     Social History   . Marital status: Widowed     Spouse name: N/A   . Number of children: N/A   . Years of education: N/A     Occupational History   . Not on file.     Social History Main Topics   . Smoking status: Former Smoker     Types: Cigarettes   . Smokeless tobacco: Never Used   . Alcohol use No   . Drug use: No   . Sexual activity: Not on file     Other Topics Concern   . Not on file     Social History Narrative   . No narrative on file       Prior to Admission medications    Medication Sig Start Date End Date Taking? Authorizing Provider   desvenlafaxine succinate (PRISTIQ) 25 MG TB24 extended release tablet Take 1 tablet by mouth daily 08/26/16  Yes Merita Norton, MD   LORazepam (ATIVAN) 0.5 MG tablet Take 1 tablet by mouth every 8 hours as needed for Anxiety ((severe)) for up to 30 days.. 08/26/16 09/25/16 Yes Merita Norton, MD   atorvastatin (LIPITOR) 40 MG tablet Take 40 mg by mouth daily   Yes Historical Provider, MD   DOXYLAMINE-DM PO Take 1 tablet by mouth nightly   Yes Historical Provider, MD   levothyroxine (SYNTHROID) 100 MCG tablet Take 100 mcg by mouth daily 05/11/15  Yes Historical Provider, MD   omeprazole (PRILOSEC) 20 MG delayed release capsule Take 20 mg by mouth daily   Yes Historical Provider, MD   atenolol (TENORMIN) 25 MG tablet Take 25 mg by mouth daily   Yes Historical Provider, MD   Cetirizine HCl (ZYRTEC ALLERGY PO) Take 1 tablet by mouth as needed    Historical  Provider, MD   fluticasone (FLONASE) 50 MCG/ACT nasal spray 1 spray by Nasal route as needed for Rhinitis    Historical Provider, MD   valsartan (DIOVAN) 80 MG tablet Take 1 tablet by mouth daily 07/31/16   Merita Norton, MD       Allergies as of 08/28/2016 - Review Complete 08/28/2016   Allergen Reaction Noted   . Morphine Nausea And Vomiting 02/28/2015       Patient Active Problem List   Diagnosis   . Hypertension   . Hyperlipidemia   . Thyroid disease   . Tension pneumothorax   . Tobacco use   . Lymphadenopathy   . Pedal edema   . Ex-cigarette smoker   . COPD (chronic obstructive pulmonary disease) (HCC)   . Dyspnea on exertion   . Obesity (BMI 30-39.9)   . OSA (obstructive sleep apnea)       Past Medical History:  Diagnosis Date   . Hyperlipidemia    . Hypertension    . Thyroid disease        Past Surgical History:   Procedure Laterality Date   . BACK SURGERY  1990    low back    . BREAST BIOPSY      1970's or 80's   . LUNG SURGERY Right 07/06/2015    Robotic right apical bleb resection   . NECK SURGERY  1992       Family History   Problem Relation Age of Onset   . Cancer Father         pancreatic cancer    . Diabetes Sister    . Thyroid Disease Sister         two sister    . Diabetes Brother         two brothers         Objective:     Vitals:    08/28/16 1209   BP: 124/66   Pulse: 76   SpO2: 97%   Weight: 210 lb (95.3 kg)   Height: 5\' 5"  (1.651 m)     Neck circumference: 16.5  Inches  Epworth - Total score: 5    Gen: No distress.   Eyes: PERRL. No sclera icterus. No conjunctival injection.   ENT: No discharge. Pharynx clear. External appearance of ears and nose normal.  Neck: Trachea midline. No obvious mass.    Resp: No accessory muscle use. No crackles. No wheezes. No rhonchi. No dullness on percussion.  CV: Regular rate. Regular rhythm. No murmur or rub. No edema.   GI: Non-tender. Non-distended. No hernia.   Skin: Warm, dry, normal texture and turgor. No nodule on exposed extremities.   Lymph: No cervical  LAD. No supraclavicular LAD.   M/S: No cyanosis. No clubbing. No joint deformity.    Psych: Oriented x 3. No anxiety.  Awake. Alert. Intact judgement and insight.    Mallampati Airway Classification:   [] 1 [] 2 [x] 3 [] 4        Sleep Complaints/Symptoms:    Normal Bedtime:      Normal Wake Time:   Average Sleep Time:  7-8 hrs     Number of Awakenings: 2-3    Duration of Sleep Complaints:  10 years    [x]  Snoring     []  Improved []  Not Improved    [x]  Choking/Gasping for Breath  []  Improved []  Not Improved       []  Witnessed Apneas              []  Improved []  Not Improved  [x]  Daytime Sleepiness             []  Improved []  Not Improved  [x]  Morning Headaches    []  Improved []  Not Improved  []  Frequent Awakenings       []  Improved []  Not Improved  []  Jerky Movements   []  Improved []  Not Improved   []  Restless Legs   []  Improved []  Not Improved   []  Difficulty Initiating Sleep  []  Improved []  Not Improved   []  Difficulty Maintaining Sleep  []  Improved []  Not Improved   []  Restless Sleep    []  Improved []  Not Improved   []  Sleep Paralysis    []  Improved []  Not Improved   []  Muscle Weakness w/ Emotion  []  Improved []  Not Improved  []  Other :     CPAP Usage:    [x]   Patient has never worn  CPAP  []   Patient has worn CPAP previously but discontinued use  []   Current PAP user,  [years]   []   Patient Tolerates Well   []   Patient Does Not Tolerate     []   Patient Uses CPAP      []   More Than 4 Hours      []   Less Than 4 Hours  []   CPAP/BPAP/ASV Pressure Readings   []   CPAP Pressure      cm H20   []   BPAP Pressure       cm H20   []   ASV Pressure         cm H20      Assessment:      Diagnosis:     OSA  Hypersomnia        Plan:        Sleep Study:     [x]   Sleep hygiene/ relaxation methods & CBTi principles review with patient     [x]   HST - Home Sleep Study   []   PSG - Overnight Diagnostic Polysomnogram     []   CPAP Titration    []  Split Night Study    []  BiLevel Titration    []  ASV - Auto-Servo Ventilation Titration       []   MSLT  - Multiple Sleep Latency Test   []   MWT - Maintenance of Wakefulness Test    CPAP Therapy:     []   Patient to be seen for new mask fitting/desensitization   []   AutoPAP Titration    []   CPAP supplies and equipment at ________cmH2O    []   Continue same CPAP pressure   []   Change CPAP pressure to _______cm H2O   []   CPAP supplies only, no pressure change   []   Refer for an oral appliance       Medications:       []   Continue current medication    []   Add Medication:  ________________    Follow-Up:     []   No follow up required. Patient to return as needed.    []   2 weeks   []   4 weeks   []   2 months   []   4 months   []   6 months   []   1 year for CPAP compliance evaluation. Patient to return sooner, as needed.     [x]   Follow up after sleep study   []   Other: ______________    No orders of the defined types were placed in this encounter.         Electronically signed by Erby Pian, MD on 08/28/2016 at 12:22 PM

## 2016-09-16 ENCOUNTER — Inpatient Hospital Stay: Admit: 2016-09-16 | Attending: Neurology | Primary: Family Medicine

## 2016-09-16 NOTE — Progress Notes (Signed)
Tiffany Little  Oct 08, 1945  arrived at [facility] Center on 09/16/2016 for set up and instruction of home sleep study [facility].  she was instructed on proper set-up and operation of HST unit. Written instructions with set up diagram given for reference and reinforcement of verbal instruction and demonstration. she was able to return demonstration appropriately. No home environment, vision, dexterity, comprehension concerns with this patient based on completed forms and patient interactions. Patient will return unit after 2 nights as instructed.    Electronically signed by Tish FredericksonLisa K Krishna Dancel on 09/16/2016 at 11:00 AM

## 2016-09-16 NOTE — Progress Notes (Signed)
Results for the most recent sleep study on Tiffany DoppSusan M Granlund  1946/01/24 are finalized and available.    Please see media tab.    Electronically signed by Deeann CreeJamie Younis Mathey on 09/26/2016 at 12:43 AM

## 2016-09-18 ENCOUNTER — Inpatient Hospital Stay: Admit: 2016-09-18 | Attending: Critical Care Medicine | Primary: Family Medicine

## 2016-09-24 ENCOUNTER — Encounter: Primary: Family Medicine

## 2016-11-15 ENCOUNTER — Ambulatory Visit: Admit: 2016-11-15 | Discharge: 2016-11-15 | Payer: MEDICARE | Attending: Family Medicine | Primary: Family Medicine

## 2016-11-15 DIAGNOSIS — F3341 Major depressive disorder, recurrent, in partial remission: Secondary | ICD-10-CM

## 2016-11-15 MED ORDER — VALSARTAN 80 MG PO TABS
80 | ORAL_TABLET | Freq: Every day | ORAL | 3 refills | Status: DC
Start: 2016-11-15 — End: 2016-11-25

## 2016-11-15 MED ORDER — KETOCONAZOLE 2 % EX CREA
2 | Freq: Two times a day (BID) | CUTANEOUS | 5 refills | Status: DC
Start: 2016-11-15 — End: 2016-12-18

## 2016-11-15 NOTE — Progress Notes (Signed)
Vaccine Information Sheet, "Influenza - Inactivated"  given to Paschal Dopp, or parent/legal guardian of  ANDI LAYFIELD and verbalized understanding.    Patient responses:    Have you ever had a reaction to a flu vaccine? No  Are you able to eat eggs without adverse effects?  Yes  Do you have any current illness?  No  Have you ever had Guillian Barre Syndrome?  No    Flu vaccine given per order. Please see immunization tab.

## 2016-11-15 NOTE — Progress Notes (Signed)
Chief Complaint   Patient presents with   . Abdominal Pain     patient is being seen today for stomache issues/has had diarrhea/EGD on 9/26 and Colonoscopy on 9/26   . Hypertension     valsartan recall    . Depression     Known depressive symptoms, tolerating Pristiq at low dose, still with positive depression screening        HOPI NARRATIVE:    Patient is here primarily for follow-up on depression.  She is taking the Pristiq at low dose and tolerating it fairly well.  She is not convinced it is helpful.  She does show me her bottle of Ativan and she has used perhaps 5 of those come since I gave her the bottle of 10.  She says they're very helpful when she needs to use them.     New problem has occurred including abdominal pain in epigastrium starting during the night, waking her up.  WIth diarrhea also.  She has already seen Dr. Wilhelmina Mcardle and is scheduled for upper and lower endoscopy.  EGD on 9/26 and lower on 10/4. No med or other changes made by that specialist.  Limited evaluation of this problem was done today.    Pressure control is pretty good on valsartan.  She has not been notified by pharmacy or her insurance that her current dose is unavailable so she will just need a refill of that.  Refill should technically be available at the pharmacy but was provided to her today.    Also saw eye doc and cataract surgery as planned with Dr. Franklyn Lor on left eye 10/12.  Right eye two weeks later.      And awaiting PFT result, it is not visible in this chart that I can find but has CPAP machine now after sleep study and OSA documented on test.  She will stop by the pulmonologist office on her way out of our building today to see if this PFT result is available.    Sees plastic surgeon 9/26.    Getting ready to go to [place].  Has used only a few ativan, still taking pristiq but not sure it is helping.  Depression is still present but not severe, has ativan for anxiety.  Will stay on pristiq for the next month.      Agrees to  influenza vaccine.      Patient is established unless otherwise noted.  Above chief complaint and HOPI obtained by this physician provider.     ROS    Allergies   Allergen Reactions   . Morphine Nausea And Vomiting     Allergy history updated.    Outpatient Prescriptions Marked as Taking for the 11/15/16 encounter (Office Visit) with Merita Norton, MD   Medication Sig Dispense Refill   . ketoconazole (NIZORAL) 2 % cream Apply 1 Applicatorful topically 2 times daily 30 g 5   . valsartan (DIOVAN) 80 MG tablet Take 1 tablet by mouth daily 90 tablet 3   . desvenlafaxine succinate (PRISTIQ) 25 MG TB24 extended release tablet Take 1 tablet by mouth daily 90 tablet 1   . atorvastatin (LIPITOR) 40 MG tablet Take 40 mg by mouth daily     . Cetirizine HCl (ZYRTEC ALLERGY PO) Take 1 tablet by mouth as needed     . fluticasone (FLONASE) 50 MCG/ACT nasal spray 1 spray by Nasal route as needed for Rhinitis     . DOXYLAMINE-DM PO Take 1 tablet by mouth nightly     .  levothyroxine (SYNTHROID) 100 MCG tablet Take 100 mcg by mouth daily     . omeprazole (PRILOSEC) 20 MG delayed release capsule Take 20 mg by mouth daily     . atenolol (TENORMIN) 25 MG tablet Take 25 mg by mouth daily         Tobacco use history updated.   History   Smoking Status   . Former Smoker   . Types: Cigarettes   Smokeless Tobacco   . Never Used        Nursing note reviewed.    Vitals:    11/15/16 0951   BP: 136/80   Site: Left Upper Arm   Position: Sitting   Cuff Size: Large Adult   Pulse: 71   SpO2: 98%   Weight: 207 lb 6.4 oz (94.1 kg)          BP Readings from Last 3 Encounters:   11/15/16 136/80   08/28/16 124/66   08/26/16 126/84     Wt Readings from Last 3 Encounters:   11/15/16 207 lb 6.4 oz (94.1 kg)   08/28/16 210 lb (95.3 kg)   08/26/16 210 lb 9.6 oz (95.5 kg)     Body mass index is 34.51 kg/m.    No results found for this visit on 11/15/16.      Physical Exam   Constitutional: She is oriented to person, place, and time. She appears well-developed and  well-nourished. No distress.   HENT:   Head: Normocephalic.   Mouth/Throat: Oropharynx is clear and moist.   Eyes: Pupils are equal, round, and reactive to light. Conjunctivae are normal. Right eye exhibits no discharge. Left eye exhibits no discharge.   Neck: Normal range of motion.   Cardiovascular: Normal rate, regular rhythm and normal heart sounds.    No murmur heard.  Pulmonary/Chest: Effort normal and breath sounds normal. No respiratory distress. She has no wheezes. She has no rales.   Neurological: She is alert and oriented to person, place, and time.   Skin: Skin is warm and dry. She is not diaphoretic.   Psychiatric: She has a normal mood and affect. Her behavior is normal.   Nursing note and vitals reviewed.          _________________________________________________  Assessment:     Tiffany Little was seen today for abdominal pain, hypertension and depression.    Diagnoses and all orders for this visit:    Recurrent major depressive disorder, in partial remission (HCC)    Positive depression screening  -     Positive Screen for Clinical Depression with a Documented Follow-up Plan [phone]    Essential hypertension  -     valsartan (DIOVAN) 80 MG tablet; Take 1 tablet by mouth daily    Need for influenza vaccination  -     INFLUENZA, HIGH DOSE, 65 YRS +, IM, PF, PREFILL SYR, 0.5ML (FLUZONE HD)    Need for pneumococcal vaccination  -     Pneumococcal conjugate vaccine 13-valent    Epigastric pain    Other orders  -     ketoconazole (NIZORAL) 2 % cream; Apply 1 Applicatorful topically 2 times daily      Problems listed above are stable and therapeutic plan is unchanged unless otherwise specified.  GI problem is new but not really addressed by this provider.  Hypertension is under control and refill was provided.  Mood problems are assessed and she may continue the Pristiq at 25 mg and Ativan rarely when necessary.  We will  give her immunizations as above today.  See orders above and comments below for details of workup  or medication orders.          _________________________________________________  Plan:     She will have GI send me notes.      Meds sent as above.  Imm as above.  She will bring pneumovax date from 2016.      Return in about 7 months (around 06/15/2017), or if symptoms worsen or fail to improve, for pv htn with fbw.      Electronically signed by Merita NortonPenny Shahzaib Azevedo, MD on 11/15/16 at 10:03 AM

## 2016-11-15 NOTE — Progress Notes (Signed)
On the basis of positive PHQ-9 screening (PHQ-9 Total Score: 16), the following plan was implemented: Patient will continue on current medication which is somewhat helpful and follow with specialist when necessary.  Patient will follow-up in 6 month(s) with PCP.

## 2016-11-19 ENCOUNTER — Encounter: Attending: Family Medicine | Primary: Family Medicine

## 2016-11-21 ENCOUNTER — Encounter: Attending: Family Medicine | Primary: Family Medicine

## 2016-11-25 ENCOUNTER — Encounter

## 2016-11-25 MED ORDER — VALSARTAN 80 MG PO TABS
80 | ORAL_TABLET | Freq: Every day | ORAL | 3 refills | Status: DC
Start: 2016-11-25 — End: 2016-11-25

## 2016-11-25 NOTE — Telephone Encounter (Signed)
Valsartan gonna been out since Friday wont get from mail order for a week or so would like a few sent in she is taking one every other day. [facility]

## 2016-11-25 NOTE — Telephone Encounter (Signed)
From: Paschal Dopp  To: Merita Norton, MD  Sent: 11/25/2016 1:53 PM EDT  Subject: Prescription Question    Valsartan 80 mg hasn't been shipped yet from [facility]. I have been out since Sept. 2 2. Could you call me in a few from [facility]? thank you Sean Macwilliams [phone]

## 2016-11-26 ENCOUNTER — Ambulatory Visit
Admit: 2016-11-26 | Discharge: 2016-11-26 | Payer: PRIVATE HEALTH INSURANCE | Attending: Critical Care Medicine | Primary: Family Medicine

## 2016-11-26 DIAGNOSIS — G4733 Obstructive sleep apnea (adult) (pediatric): Secondary | ICD-10-CM

## 2016-11-26 MED ORDER — VALSARTAN 80 MG PO TABS
80 | ORAL_TABLET | Freq: Every day | ORAL | 3 refills | Status: DC
Start: 2016-11-26 — End: 2017-03-11

## 2016-11-26 NOTE — Assessment & Plan Note (Signed)
C/w quitting smoking

## 2016-11-26 NOTE — Assessment & Plan Note (Signed)
She is on CPAP now

## 2016-11-26 NOTE — Assessment & Plan Note (Signed)
Advised to loose weight with diet and exercise

## 2016-11-26 NOTE — Assessment & Plan Note (Signed)
Loose weight  C/w quitting smoking  Graded exercise

## 2016-11-26 NOTE — Progress Notes (Signed)
Tiffany Little  [birth year]  Referring Provider: Dr.Hogan    Subjective:     Chief Complaint   Patient presents with   . Results     PFT       HPI  Tiffany Little is a 71 y.o. female has come back as a follow up. She has a 36 pk yr smoking which she quit in 1982. She was seen for the cough. She has occasional cough with very little phlegm.She says that it is mostly during the Spring and fall.She is on Zyrtec and Flonase. She had a PFT done on 09/18/16 which showed that she has essentially normal PFT.She has no SOB, no hemoptysis, no loss of weight. She has been diagnosed with OSA and she is on CPAP for the last 1 month being followed by Dr. Lanny Hurst.    Current Outpatient Prescriptions   Medication Sig Dispense Refill   . ketoconazole (NIZORAL) 2 % cream Apply 1 Applicatorful topically 2 times daily 30 g 5   . desvenlafaxine succinate (PRISTIQ) 25 MG TB24 extended release tablet Take 1 tablet by mouth daily 90 tablet 1   . atorvastatin (LIPITOR) 40 MG tablet Take 40 mg by mouth daily     . Cetirizine HCl (ZYRTEC ALLERGY PO) Take 1 tablet by mouth as needed     . fluticasone (FLONASE) 50 MCG/ACT nasal spray 1 spray by Nasal route as needed for Rhinitis     . DOXYLAMINE-DM PO Take 1 tablet by mouth nightly     . levothyroxine (SYNTHROID) 100 MCG tablet Take 100 mcg by mouth daily     . omeprazole (PRILOSEC) 20 MG delayed release capsule Take 20 mg by mouth daily     . atenolol (TENORMIN) 25 MG tablet Take 25 mg by mouth daily     . valsartan (DIOVAN) 80 MG tablet Take 1 tablet by mouth daily 15 tablet 3     No current facility-administered medications for this visit.        Allergies   Allergen Reactions   . Morphine Nausea And Vomiting       Past Medical History:   Diagnosis Date   . Hyperlipidemia    . Hypertension    . Thyroid disease        Past Surgical History:   Procedure Laterality Date   . BACK SURGERY  1990    low back    . BREAST BIOPSY      1970's or 80's   . LUNG SURGERY Right 07/06/2015    Robotic right apical bleb  resection   . NECK SURGERY  1992       Social History     Social History   . Marital status: Widowed     Spouse name: N/A   . Number of children: N/A   . Years of education: N/A     Social History Main Topics   . Smoking status: Former Smoker     Types: Cigarettes   . Smokeless tobacco: Never Used   . Alcohol use No   . Drug use: No   . Sexual activity: Not Asked     Other Topics Concern   . None     Social History Narrative   . None       Review of Systems   Constitutional: Negative.    HENT: Negative.    Eyes: Negative.    Respiratory: Negative.    Cardiovascular: Negative.    Gastrointestinal: Negative.    Genitourinary: Negative.  Musculoskeletal: Negative.    Skin: Negative.    Neurological: Negative.    Endo/Heme/Allergies: Negative.    Psychiatric/Behavioral: Negative.        Objective:   BP 126/84   Pulse 64   Ht 5\' 5"  (1.651 m)   Wt 212 lb (96.2 kg)   SpO2 94%   BMI 35.28 kg/m   Body mass index is 35.28 kg/m.  Sleep Medicine 08/28/2016 08/28/2016 08/05/2016   Sitting and reading - 0 0   Watching TV - 2 0   Sitting, inactive in a public place (e.g. a theatre or a meeting) - 0 0   As a passenger in a car for an hour without a break - 0 0   Lying down to rest in the afternoon when circumstances permit - 3 1   Sitting and talking to someone - 0 0   Sitting quietly after a lunch without alcohol - 0 0   In a car, while stopped for a few minutes in traffic - 0 0   Total score - 5 1   Neck circumference 16.5 16.5 16     Mallampati 2    Physical Exam   Constitutional: She is oriented to person, place, and time. She appears well-developed and well-nourished.   Obese     HENT:   Head: Normocephalic and atraumatic.   Eyes: Pupils are equal, round, and reactive to light. Conjunctivae are normal.   Neck: Normal range of motion. Neck supple.   Cardiovascular: Normal rate, regular rhythm and normal heart sounds.    Pulmonary/Chest: Effort normal and breath sounds normal.   Abdominal: Soft. Bowel sounds are normal.    Musculoskeletal: Normal range of motion.   Neurological: She is alert and oriented to person, place, and time.   Skin: Skin is warm and dry.   Vitals reviewed.      Radiology: none    Assessment and Plan     Problem List     Ex-cigarette smoker     C/w quitting smoking         Dyspnea on exertion     Loose weight  C/w quitting smoking  Graded exercise           Obesity (BMI 30-39.9)     Advised to loose weight with diet and exercise           OSA (obstructive sleep apnea)     She is on CPAP now         Cough     Cough improved.  It appears likely sec to seasonal allergies  C/w Zyrtec and flonase                      Return if symptoms worsen or fail to improve.     Progress notes sent to the referring Provider    Ardelle LeschesShakeel A Shelvia Fojtik MD  11/26/2016  11:51 AM

## 2016-11-26 NOTE — Assessment & Plan Note (Signed)
Cough improved.  It appears likely sec to seasonal allergies  C/w Zyrtec and flonase

## 2016-12-04 ENCOUNTER — Inpatient Hospital Stay: Admit: 2016-12-04 | Payer: MEDICARE | Attending: Neurology | Primary: Family Medicine

## 2016-12-04 NOTE — Progress Notes (Signed)
[facility]      Evorn Gong MD, FACP, [facility]  Erby Pian MD, [place], [place]    Jacklyn Shell MD, [facility]  Georjean Mode MD, FCCP      [address]  [place], [place] [address]   PH: [phone]  F: [phone]     Subjective:     Patient ID: Tiffany Little is a 71 y.o. female, referred to the sleep center for   Chief Complaint   Patient presents with   . Sleep Apnea   . 1 Month Follow-Up   .    Referring physician:  Dr Mathews Robinsons     History:     snoring, EDS for 10 yrs  Gasping in sleep has been generally feeling weak, tired.    12/04/16    Underwent HST 7/'18.   AHI  15;    MIN  O2 SAT  75%  GIVEN  APAP  AT  4/20  CM H2O;  USES IT REGULARLY.  SLEEPS A LOT BETTER.  FEELS MORE RESTED  AM;    NOT V TIRED OR SLEEPY DURING DAY.  RAMAINS MORE ACTIVE.         Social History     Social History   . Marital status: Widowed     Spouse name: N/A   . Number of children: N/A   . Years of education: N/A     Occupational History   . Not on file.     Social History Main Topics   . Smoking status: Former Smoker     Types: Cigarettes   . Smokeless tobacco: Never Used   . Alcohol use No   . Drug use: No   . Sexual activity: Not on file     Other Topics Concern   . Not on file     Social History Narrative   . No narrative on file       Prior to Admission medications    Medication Sig Start Date End Date Taking? Authorizing Provider   valsartan (DIOVAN) 80 MG tablet Take 1 tablet by mouth daily 11/26/16  Yes Merita Norton, MD   ketoconazole (NIZORAL) 2 % cream Apply 1 Applicatorful topically 2 times daily 11/15/16  Yes Merita Norton, MD   desvenlafaxine succinate (PRISTIQ) 25 MG TB24 extended release tablet Take 1 tablet by mouth daily 08/26/16  Yes Merita Norton, MD   atorvastatin (LIPITOR) 40 MG tablet Take 40 mg by mouth daily   Yes Historical Provider, MD   Cetirizine HCl (ZYRTEC ALLERGY PO) Take 1 tablet by mouth as needed   Yes Historical Provider, MD   fluticasone (FLONASE) 50 MCG/ACT nasal  spray 1 spray by Nasal route as needed for Rhinitis   Yes Historical Provider, MD   DOXYLAMINE-DM PO Take 1 tablet by mouth nightly   Yes Historical Provider, MD   levothyroxine (SYNTHROID) 100 MCG tablet Take 100 mcg by mouth daily 05/11/15  Yes Historical Provider, MD   omeprazole (PRILOSEC) 20 MG delayed release capsule Take 20 mg by mouth daily   Yes Historical Provider, MD   atenolol (TENORMIN) 25 MG tablet Take 25 mg by mouth daily   Yes Historical Provider, MD       Allergies as of 12/04/2016 - Review Complete 12/04/2016   Allergen Reaction Noted   . Morphine Nausea And Vomiting 02/28/2015       Patient Active Problem List   Diagnosis   . Hypertension   .  Hyperlipidemia   . Thyroid disease   . Tension pneumothorax   . Tobacco use   . Lymphadenopathy   . Pedal edema   . Ex-cigarette smoker   . Dyspnea on exertion   . Obesity (BMI 30-39.9)   . OSA (obstructive sleep apnea)   . Recurrent major depressive disorder, in partial remission (HCC)   . Cough       Past Medical History:   Diagnosis Date   . Hyperlipidemia    . Hypertension    . Thyroid disease        Past Surgical History:   Procedure Laterality Date   . BACK SURGERY  1990    low back    . BREAST BIOPSY      1970's or 80's   . LUNG SURGERY Right 07/06/2015    Robotic right apical bleb resection   . NECK SURGERY  1992       Family History   Problem Relation Age of Onset   . Cancer Father         pancreatic cancer    . Diabetes Sister    . Thyroid Disease Sister         two sister    . Diabetes Brother         two brothers         Objective:     There were no vitals filed for this visit.     Inches  Epworth - Total score: 1    Gen: No distress.   Eyes: PERRL. No sclera icterus. No conjunctival injection.   ENT: No discharge. Pharynx clear. External appearance of ears and nose normal.  Neck: Trachea midline. No obvious mass.    Resp: No accessory muscle use. No crackles. No wheezes. No rhonchi. No dullness on percussion.  CV: Regular rate. Regular rhythm. No  murmur or rub. No edema.   GI: Non-tender. Non-distended. No hernia.   Skin: Warm, dry, normal texture and turgor. No nodule on exposed extremities.   Lymph: No cervical LAD. No supraclavicular LAD.   M/S: No cyanosis. No clubbing. No joint deformity.    Psych: Oriented x 3. No anxiety.  Awake. Alert. Intact judgement and insight.    Mallampati Airway Classification:   1 2 3 4        Sleep Complaints/Symptoms:    Normal Bedtime:      Normal Wake Time:   Average Sleep Time:  7-8 hrs     Number of Awakenings: 2-3  NOW 1-2/ NIGHT    Duration of Sleep Complaints:  10 years     Snoring      Improved  Not Improved     Choking/Gasping for Breath   Improved  Not Improved        Witnessed Apneas               Improved  Not Improved   Daytime Sleepiness              Improved  Not Improved   Morning Headaches     Improved  Not Improved   Frequent Awakenings        Improved  Not Improved   Jerky Movements    Improved  Not Improved    Restless Legs    Improved  Not Improved    Difficulty Initiating Sleep   Improved  Not Improved    Difficulty Maintaining Sleep   Improved  Not Improved    Restless Sleep      Improved []  Not Improved   []  Sleep Paralysis    []  Improved []  Not Improved   []  Muscle Weakness w/ Emotion  []  Improved []  Not Improved  []  Other :     CPAP Usage:    []   Patient has never worn CPAP  []   Patient has worn CPAP previously but discontinued use  [x]   Current PAP user,  [years]   6-7 WKS   [x]   Patient Tolerates Well   []   Patient Does Not Tolerate     [x]   Patient Uses CPAP      [x]   More Than 4 Hours      []   Less Than 4 Hours  [x]   CPAP/BPAP/ASV Pressure Readings   [x]   APAP Pressure   4/20   cm H20   []   BPAP Pressure       cm H20   []   ASV Pressure         cm H20      Assessment:      Diagnosis:     OSA  Hypersomnia        Plan:        Sleep Study:     [x]   Sleep hygiene/ relaxation methods & CBTi principles review with  patient     []   HST - Home Sleep Study   []   PSG - Overnight Diagnostic Polysomnogram     []   CPAP Titration    []  Split Night Study    []  BiLevel Titration    []  ASV - Auto-Servo Ventilation Titration       []   MSLT - Multiple Sleep Latency Test   []   MWT - Maintenance of Wakefulness Test    CPAP Therapy:     []   Patient to be seen for new mask fitting/desensitization   []   AutoPAP Titration    []   CPAP supplies and equipment at ________cmH2O    [x]   Continue same APAP pressure   4/20   []   Change CPAP pressure to _______cm H2O   []   CPAP supplies only, no pressure change   []   Refer for an oral appliance       Medications:       []   Continue current medication    []   Add Medication:  ________________    Follow-Up:     []   No follow up required. Patient to return as needed.    []   2 weeks   []   4 weeks   []   2 months   []   4 months   [x]   6 months   []   1 year for CPAP compliance evaluation. Patient to return sooner, as needed.     []   Follow up after sleep study   []   Other: ______________    No orders of the defined types were placed in this encounter.         Electronically signed by Erby PianAMRIT Kylynn Street, MD on 12/04/2016 at 9:46 AM

## 2016-12-09 NOTE — Telephone Encounter (Signed)
From: Paschal Dopp  To: Merita Norton, MD  Sent: 12/09/2016 12:00 PM EDT  Subject: Prescription Question    KETOCONAZOLE 2% CRE  TBD  Rx #: [id number]  Quantity: 60  Item status: Cancelled  This item has been cancelled. For questions please call a [facility] representative at [phone], Mon-Fri: 8:00am - 11:00pm (EST) and Sat: 8:00am - 6:30pm (EST). I brought a letter I received from [facility] to your office Oct. 3rd they needed more info before they would fill this.Marland KitchenMarland KitchenI'm so sorry for all the trouble. I use this for my skin folds...fat lady disgusting stuff

## 2016-12-18 MED ORDER — KETOCONAZOLE 2 % EX CREA
2 | Freq: Two times a day (BID) | CUTANEOUS | 5 refills | Status: AC
Start: 2016-12-18 — End: ?

## 2016-12-18 NOTE — Telephone Encounter (Signed)
From: Paschal DoppSusan M Chretien  To: Merita NortonPenny Hogan, MD  Sent: 12/18/2016 10:53 AM EDT  Subject: Prescription Question    There is still an issue with Ketoconazole...if [facility] is the problem would you please call this into [facility].Marland Kitchen.Marland Kitchen.I am out thank you Darl PikesSusan

## 2017-02-06 ENCOUNTER — Telehealth

## 2017-02-06 MED ORDER — LORAZEPAM 0.5 MG PO TABS
0.5 | ORAL_TABLET | Freq: Three times a day (TID) | ORAL | 0 refills | Status: AC | PRN
Start: 2017-02-06 — End: 2017-03-08

## 2017-02-06 NOTE — Telephone Encounter (Signed)
Controlled Substances Monitoring:     RX Monitoring 02/06/2017   Attestation The Prescription Monitoring Report for this patient was reviewed today.       Med sent.

## 2017-02-06 NOTE — Telephone Encounter (Signed)
Patient called stating her son passed away, said this was the 3rd son to pass away in December. She states squad was called but she didn't want to go. She asked if you can send her something in to help? Please Advise.

## 2017-02-07 NOTE — Telephone Encounter (Signed)
I called patient let her know ativan sent

## 2017-02-11 ENCOUNTER — Encounter: Attending: Family Medicine | Primary: Family Medicine

## 2017-03-03 ENCOUNTER — Encounter

## 2017-03-03 MED ORDER — DESVENLAFAXINE SUCCINATE ER 25 MG PO TB24
25 | ORAL_TABLET | Freq: Every day | ORAL | 1 refills | Status: DC
Start: 2017-03-03 — End: 2017-03-11

## 2017-03-03 NOTE — Telephone Encounter (Signed)
From: Paschal DoppSusan M Metter  To: Merita NortonPenny Hogan, MD  Sent: 03/03/2017 8:37 AM EST  Subject: Prescription Question    desvenlafaxine succinate 25 MG Tb24 extended release tablet I will be out of these Jan.1 can you call this into the [facility]...they will transfer to [place]. Thank you Sharol RousselSusan Fleeger

## 2017-03-06 ENCOUNTER — Encounter: Attending: Family Medicine | Primary: Family Medicine

## 2017-03-11 ENCOUNTER — Encounter

## 2017-03-11 ENCOUNTER — Ambulatory Visit: Admit: 2017-03-11 | Discharge: 2017-03-11 | Payer: MEDICARE | Attending: Family Medicine | Primary: Family Medicine

## 2017-03-11 DIAGNOSIS — F32A Depression, unspecified: Secondary | ICD-10-CM

## 2017-03-11 LAB — LIPID, FASTING
Cholesterol, Fasting: 182 mg/dL (ref 0–199)
HDL: 54 mg/dL (ref 40–60)
LDL Calculated: 105 mg/dL — ABNORMAL HIGH (ref <100)
Triglyceride, Fasting: 113 mg/dL (ref 0–150)
VLDL Cholesterol Calculated: 23 mg/dL

## 2017-03-11 LAB — CBC
Hematocrit: 42.8 % (ref 36.0–48.0)
Hemoglobin: 14.3 g/dL (ref 12.0–16.0)
MCH: 32.2 pg (ref 26.0–34.0)
MCHC: 33.3 g/dL (ref 31.0–36.0)
MCV: 96.6 fL (ref 80.0–100.0)
MPV: 9.1 fL (ref 5.0–10.5)
Platelets: 251 10*3/uL (ref 135–450)
RBC: 4.43 M/uL (ref 4.00–5.20)
RDW: 13.2 % (ref 12.4–15.4)
WBC: 6.1 10*3/uL (ref 4.0–11.0)

## 2017-03-11 LAB — COMPREHENSIVE METABOLIC PANEL, FASTING
ALT: 22 U/L (ref 10–40)
AST: 19 U/L (ref 15–37)
Albumin/Globulin Ratio: 1.7 (ref 1.1–2.2)
Albumin: 4.5 g/dL (ref 3.4–5.0)
Alkaline Phosphatase: 81 U/L (ref 40–129)
Anion Gap: 12 (ref 3–16)
BUN: 12 mg/dL (ref 7–20)
CO2: 25 mmol/L (ref 21–32)
Calcium: 9.6 mg/dL (ref 8.3–10.6)
Chloride: 99 mmol/L (ref 99–110)
Creatinine: 0.6 mg/dL (ref 0.6–1.2)
GFR African American: >60 (ref >60)
GFR Non-African American: >60 (ref >60)
Globulin: 2.7 g/dL
Glucose, Fasting: 97 mg/dL (ref 70–99)
Potassium: 4.7 mmol/L (ref 3.5–5.1)
Sodium: 136 mmol/L (ref 136–145)
Total Bilirubin: 0.6 mg/dL (ref 0.0–1.0)
Total Protein: 7.2 g/dL (ref 6.4–8.2)

## 2017-03-11 LAB — TSH: TSH: 0.18 u[IU]/mL — ABNORMAL LOW (ref 0.27–4.20)

## 2017-03-11 LAB — HEPATITIS C ANTIBODY: Hep C Ab Interp: NONREACTIVE

## 2017-03-11 MED ORDER — ATORVASTATIN CALCIUM 40 MG PO TABS
40 | ORAL_TABLET | Freq: Every day | ORAL | 3 refills | Status: DC
Start: 2017-03-11 — End: 2018-03-13

## 2017-03-11 MED ORDER — TETANUS-DIPHTH-ACELL PERTUSSIS 5-2.5-18.5 LF-MCG/0.5 IM SUSP
Freq: Once | INTRAMUSCULAR | Status: DC
Start: 2017-03-11 — End: 2017-03-11

## 2017-03-11 MED ORDER — VALSARTAN 80 MG PO TABS
80 | ORAL_TABLET | Freq: Every day | ORAL | 3 refills | Status: DC
Start: 2017-03-11 — End: 2017-06-16

## 2017-03-11 MED ORDER — DESVENLAFAXINE SUCCINATE ER 25 MG PO TB24
25 | ORAL_TABLET | Freq: Every day | ORAL | 3 refills | Status: DC
Start: 2017-03-11 — End: 2017-06-16

## 2017-03-11 MED ORDER — OMEPRAZOLE 20 MG PO CPDR
20 | ORAL_CAPSULE | Freq: Every day | ORAL | 3 refills | Status: DC
Start: 2017-03-11 — End: 2017-06-16

## 2017-03-11 MED ORDER — LEVOTHYROXINE SODIUM 100 MCG PO TABS
100 | ORAL_TABLET | Freq: Every day | ORAL | 3 refills | Status: DC
Start: 2017-03-11 — End: 2017-03-11

## 2017-03-11 MED ORDER — ATENOLOL 25 MG PO TABS
25 | ORAL_TABLET | Freq: Every day | ORAL | 3 refills | Status: DC
Start: 2017-03-11 — End: 2018-03-13

## 2017-03-11 MED ORDER — ZOSTER VAC RECOMB ADJUVANTED 50 MCG/0.5ML IM SUSR
50 MCG/0.5ML | Freq: Once | INTRAMUSCULAR | 1 refills | Status: AC
Start: 2017-03-11 — End: 2017-03-11

## 2017-03-11 MED ORDER — LEVOTHYROXINE SODIUM 100 MCG PO TABS
100 | ORAL_TABLET | Freq: Every day | ORAL | 3 refills | Status: DC
Start: 2017-03-11 — End: 2017-11-10

## 2017-03-11 NOTE — Progress Notes (Signed)
Chief Complaint   Patient presents with   . Depression     patient is here for a recheck on depression issues recent loss of son, 3rd son to die in december    . Hypertension     patient is here for a recheck on hypertension        HOPI NARRATIVE:    BP control is excellent.      Mood as above, she had called and spoken to staff earlier (12/6, when 3rd son to pass away in December died). 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  Last seen by me in September.  On low dose pristiq due to intolerance multiple other meds and had few (10) ativan for rescue use then.  Sees pulm for resp issues including sleep apnea.  Travelled to [place] for the winter.      Patient is established unless otherwise noted.  Above chief complaint and HOPI obtained by this physician provider.     Review of Systems   Constitutional: Negative for activity change, chills, fatigue and fever.   HENT: Negative for congestion.    Respiratory: Negative for cough, chest tightness, shortness of breath and wheezing.    Cardiovascular: Negative for chest pain and leg swelling.   Gastrointestinal: Negative for abdominal pain.   Musculoskeletal: Negative for back pain and myalgias.   Skin: Negative for rash.   Psychiatric/Behavioral: Positive for dysphoric mood. Negative for behavioral problems.       Allergies   Allergen Reactions   . Morphine Nausea And Vomiting     Allergy historyupdated.    Outpatient Prescriptions Marked as Taking for the 03/11/17 encounter (Office Visit) with Merita NortonPenny Ivan Maskell, MD   Medication Sig Dispense Refill   . valsartan (DIOVAN) 80 MG tablet Take 1 tablet by mouth daily 90 tablet 3   . atenolol (TENORMIN) 25 MG tablet Take 1 tablet by mouth daily 90 tablet 3   . atorvastatin (LIPITOR) 40 MG tablet Take 1 tablet by mouth daily 90 tablet 3   . omeprazole (PRILOSEC) 20 MG delayed release capsule Take 1 capsule by mouth daily 90 capsule 3   .  desvenlafaxine succinate (PRISTIQ) 25 MG TB24 extended release tablet Take 1 tablet by mouth daily 90 tablet 3   . levothyroxine (SYNTHROID) 100 MCG tablet Take 1 tablet by mouth daily 10 tablet 3   . [EXPIRED] zoster recombinant adjuvanted vaccine (SHINGRIX) 50 MCG/0.5ML SUSR injection Inject 0.5 mLs into the muscle once for 1 dose 0.5 mL 1   . ketoconazole (NIZORAL) 2 % cream Apply 1 Applicatorful topically 2 times daily 30 g 5   . fluticasone (FLONASE) 50 MCG/ACT nasal spray 1 spray by Nasal route as needed for Rhinitis     . DOXYLAMINE-DM PO Take 1 tablet by mouth nightly         Tobacco use history updated.   History   Smoking Status   . Former Smoker   . Types: Cigarettes   Smokeless Tobacco   . Never Used        Nursing note reviewed.    Vitals:    03/11/17 1013   BP: 124/80   Site: Left Upper Arm   Position: Sitting   Cuff Size: Large Adult   Pulse: 81   SpO2: 98%   Weight: 212 lb 3.2 oz (96.3 kg)  BP Readings from Last 3 Encounters:   03/11/17 124/80   11/26/16 126/84   11/15/16 136/80     Wt Readings from Last 3 Encounters:   03/11/17 212 lb 3.2 oz (96.3 kg)   11/26/16 212 lb (96.2 kg)   11/15/16 207 lb 6.4 oz (94.1 kg)     Body mass index is 35.31 kg/m.    No results found for this visit on 03/11/17.      Physical Exam        _________________________________________________  Assessment:     Tiffany Little was seen today for depression and hypertension.    Diagnoses and all orders for this visit:    Depressive disorder  -     desvenlafaxine succinate (PRISTIQ) 25 MG TB24 extended release tablet; Take 1 tablet by mouth daily    Essential hypertension  -     valsartan (DIOVAN) 80 MG tablet; Take 1 tablet by mouth daily  -     atenolol (TENORMIN) 25 MG tablet; Take 1 tablet by mouth daily  -     Comprehensive Metabolic Panel, Fasting    Mixed hyperlipidemia  -     atorvastatin (LIPITOR) 40 MG tablet; Take 1 tablet by mouth daily  -     Lipid, Fasting    GAD (generalized anxiety disorder)  -      desvenlafaxine succinate (PRISTIQ) 25 MG TB24 extended release tablet; Take 1 tablet by mouth daily    Medication monitoring encounter  -     CBC    Fatigue, unspecified type  -     TSH without Reflex    Hyperglycemia  -     Hemoglobin A1C    Encounter for hepatitis C screening test for low risk patient  -     Hepatitis C Antibody    Need for Tdap vaccination  -     Discontinue: Tetanus-Diphth-Acell Pertussis (BOOSTRIX) injection 0.5 mL; Inject 0.5 mLs into the muscle once  -     Tdap (age 77y and older) IM (Boostrix)    Need for shingles vaccine  -     zoster recombinant adjuvanted vaccine (SHINGRIX) 50 MCG/0.5ML SUSR injection; Inject 0.5 mLs into the muscle once for 1 dose    Other orders  -     Discontinue: levothyroxine (SYNTHROID) 100 MCG tablet; Take 1 tablet by mouth daily  -     omeprazole (PRILOSEC) 20 MG delayed release capsule; Take 1 capsule by mouth daily  -     levothyroxine (SYNTHROID) 100 MCG tablet; Take 1 tablet by mouth daily      Problems listed above are stable and therapeutic plan is unchanged unless otherwise specified.  Mood issues are worse but improving.  See orders above and comments below for details of workup o rmedication orders.          _________________________________________________  Plan:     Labs, HM and refills as above.      Return in about 1 year (around 03/11/2018), or if symptoms worsen or fail to improve.      Electronically signed by Merita Norton, MD on 03/11/17 at 10:24 AM

## 2017-03-12 LAB — HEMOGLOBIN A1C
Hemoglobin A1C: 6 %
eAG: 125.5 mg/dL

## 2017-03-12 NOTE — Telephone Encounter (Signed)
[facility] asking if patient Tiffany Little is suppose to be 100 mcg now, she was on 125 mcg before. Please Advise.

## 2017-03-13 NOTE — Telephone Encounter (Signed)
Called pharmacy giving lower dose as correct dose

## 2017-04-10 NOTE — Telephone Encounter (Signed)
From: Paschal DoppSusan M Little  To: Merita NortonPenny Hogan, MD  Sent: 04/10/2017 1:13 PM EST  Subject: Visit Follow-Up Question    My Breast augmentation with Dr. Bufford Buttner'Neill has been approved by [facility]...my new surgery date is 05/08/17. I need to schedule a EKG or similar coronary clearance.Marland Kitchen.Marland Kitchen.I will be out of state 04/14/17 returning by 05/01/17. Thank you Sharol RousselSusan Demond

## 2017-04-29 NOTE — Telephone Encounter (Signed)
From: Paschal DoppSusan M Gerhart  To: Merita NortonPenny Hogan, MD  Sent: 04/29/2017 2:07 PM EST  Subject: Non-Urgent Medical Question    My appointment on March 4th, can you give me a Hep A vaccination? Thank you Darl PikesSusan

## 2017-05-05 ENCOUNTER — Ambulatory Visit: Admit: 2017-05-05 | Discharge: 2017-05-05 | Payer: MEDICARE | Attending: Family Medicine | Primary: Family Medicine

## 2017-05-05 DIAGNOSIS — Z01818 Encounter for other preprocedural examination: Secondary | ICD-10-CM

## 2017-05-05 MED ORDER — AZITHROMYCIN 250 MG PO TABS
250 MG | ORAL_TABLET | ORAL | 0 refills | Status: AC
Start: 2017-05-05 — End: 2017-05-10

## 2017-05-05 NOTE — Progress Notes (Signed)
Chief Complaint   Patient presents with   ??? Pre-op Exam     patient is going to have a breast reduction 05/08/2017 at noon at [facility]       HOPI NARRATIVE:    Clear to yellow sinus drainage for two weeks since [place].  No fever but night sweats, cough is nonproductive, no SOB.  She is scheduled for breast reduction surgery this week. She has been taking tylenol but no NSAIDS.  No form is needed and no clearance letter than she knows of or that we have been sent.        Patient is established unless otherwise noted.  Above chief complaint and HOPI obtained by this physician provider.     Review of Systems   Constitutional: Negative for activity change, chills, fatigue and fever.   HENT: Positive for congestion and sneezing. Negative for sore throat.         Scratchy throat and slight amount of yellow drainage   Respiratory: Negative for cough, chest tightness, shortness of breath and wheezing.    Cardiovascular: Negative for chest pain and leg swelling.   Gastrointestinal: Negative for abdominal pain.   Musculoskeletal: Negative for back pain and myalgias.   Skin: Negative for rash.   Psychiatric/Behavioral: Negative for behavioral problems and dysphoric mood.       Allergies   Allergen Reactions   ??? Morphine Nausea And Vomiting     Allergy historyupdated.    Outpatient Medications Marked as Taking for the 05/05/17 encounter (Office Visit) with Merita Norton, MD   Medication Sig Dispense Refill   ??? [EXPIRED] azithromycin (ZITHROMAX) 250 MG tablet Take 1 tablet by mouth See Admin Instructions for 5 days 500mg  on day 1 followed by 250mg  on days 2 - 5 6 tablet 0   ??? valsartan (DIOVAN) 80 MG tablet Take 1 tablet by mouth daily (Patient taking differently: Take 40 mg by mouth daily Indications: 0.5 only taking 40 mg ) 90 tablet 3   ??? atenolol (TENORMIN) 25 MG tablet Take 1 tablet by mouth daily 90 tablet 3   ??? atorvastatin (LIPITOR) 40 MG tablet Take 1 tablet by mouth daily 90 tablet 3   ??? omeprazole (PRILOSEC) 20 MG delayed  release capsule Take 1 capsule by mouth daily 90 capsule 3   ??? desvenlafaxine succinate (PRISTIQ) 25 MG TB24 extended release tablet Take 1 tablet by mouth daily 90 tablet 3   ??? levothyroxine (SYNTHROID) 100 MCG tablet Take 1 tablet by mouth daily 10 tablet 3   ??? ketoconazole (NIZORAL) 2 % cream Apply 1 Applicatorful topically 2 times daily 30 g 5   ??? Cetirizine HCl (ZYRTEC ALLERGY PO) Take 1 tablet by mouth as needed     ??? DOXYLAMINE-DM PO Take 1 tablet by mouth nightly         Tobacco use history updated.   Social History     Tobacco Use   Smoking Status Former Smoker   ??? Types: Cigarettes   Smokeless Tobacco Never Used        Nursing note reviewed.    Vitals:    05/05/17 1559   BP: 128/70   Site: Left Upper Arm   Position: Sitting   Cuff Size: Large Adult   Pulse: 68   SpO2: 98%   Weight: 202 lb 12.8 oz (92 kg)          BP Readings from Last 3 Encounters:   05/05/17 128/70   03/11/17 124/80   11/26/16 126/84  Wt Readings from Last 3 Encounters:   05/05/17 202 lb 12.8 oz (92 kg)   03/11/17 212 lb 3.2 oz (96.3 kg)   11/26/16 212 lb (96.2 kg)     Body mass index is 33.75 kg/m??.    No results found for this visit on 05/05/17.      Physical Exam   Constitutional: She is oriented to person, place, and time. She appears well-developed and well-nourished. No distress.   HENT:   Head: Normocephalic.   Mouth/Throat: Oropharynx is clear and moist.   Eyes: Pupils are equal, round, and reactive to light. Conjunctivae are normal. Right eye exhibits no discharge. Left eye exhibits no discharge.   Neck: Normal range of motion.   Cardiovascular: Normal rate, regular rhythm and normal heart sounds.   No murmur heard.  Pulmonary/Chest: Effort normal and breath sounds normal. No respiratory distress. She has no wheezes. She has no rales.   Neurological: She is alert and oriented to person, place, and time.   Skin: Skin is warm and dry. She is not diaphoretic.   Psychiatric: She has a normal mood and affect. Her behavior is  normal.   Nursing note and vitals reviewed.          _________________________________________________  Assessment:     Tiffany Little was seen today for pre-op exam.    Diagnoses and all orders for this visit:    Preop general physical exam    Need for hepatitis A vaccination  -     Hep A Vaccine Adult (VAQTA)    Preop examination  -     Cancel: EKG 12 Lead; Future  -     EKG 12 Lead    Acute bacterial sinusitis  -     azithromycin (ZITHROMAX) 250 MG tablet; Take 1 tablet by mouth See Admin Instructions for 5 days 500mg  on day 1 followed by 250mg  on days 2 - 5      Problems listed above are stable and therapeutic plan is unchanged unless otherwise specified.  See orders above and comments below for details of workup ormedication orders.          _________________________________________________  Plan:     EKG copy given to patient.  No forms available.  OK to proceed with surgery with URI only and no cough or SOB or fever.    Hep A vaccine also given.      Return if symptoms worsen or fail to improve.      Electronically signed by Merita NortonPenny Nahuel Wilbert, MD on 05/05/17 at 4:30 PM

## 2017-05-13 NOTE — Telephone Encounter (Signed)
From: Paschal Dopp  To: Merita Norton, MD  Sent: 05/13/2017 10:39 AM EDT  Subject: Non-Urgent Medical Question    Please tell Dr. Mathews Robinsons that my scheduled surgery with Dr. Carmon Ginsberg didn't happen...he had a medical emergency so I was unhooked and sent home..they still haven't called to re-schedule. The story of my life [place]

## 2017-06-04 ENCOUNTER — Ambulatory Visit: Primary: Family Medicine

## 2017-06-16 ENCOUNTER — Ambulatory Visit: Admit: 2017-06-16 | Discharge: 2017-06-16 | Payer: MEDICARE | Attending: Family Medicine | Primary: Family Medicine

## 2017-06-16 ENCOUNTER — Encounter: Attending: Family Medicine | Primary: Family Medicine

## 2017-06-16 DIAGNOSIS — Z0000 Encounter for general adult medical examination without abnormal findings: Secondary | ICD-10-CM

## 2017-06-16 LAB — COMPREHENSIVE METABOLIC PANEL, FASTING
ALT: 33 U/L (ref 10–40)
AST: 26 U/L (ref 15–37)
Albumin/Globulin Ratio: 1.6 (ref 1.1–2.2)
Albumin: 4.4 g/dL (ref 3.4–5.0)
Alkaline Phosphatase: 79 U/L (ref 40–129)
Anion Gap: 16 (ref 3–16)
BUN: 14 mg/dL (ref 7–20)
CO2: 21 mmol/L (ref 21–32)
Calcium: 9.8 mg/dL (ref 8.3–10.6)
Chloride: 102 mmol/L (ref 99–110)
Creatinine: 0.5 mg/dL — ABNORMAL LOW (ref 0.6–1.2)
GFR African American: >60 (ref >60)
GFR Non-African American: >60 (ref >60)
Globulin: 2.8 g/dL
Glucose, Fasting: 107 mg/dL — ABNORMAL HIGH (ref 70–99)
Potassium: 4.5 mmol/L (ref 3.5–5.1)
Sodium: 139 mmol/L (ref 136–145)
Total Bilirubin: 0.3 mg/dL (ref 0.0–1.0)
Total Protein: 7.2 g/dL (ref 6.4–8.2)

## 2017-06-16 LAB — LIPID, FASTING
Cholesterol, Fasting: 167 mg/dL (ref 0–199)
HDL: 60 mg/dL (ref 40–60)
LDL Calculated: 93 mg/dL (ref <100)
Triglyceride, Fasting: 69 mg/dL (ref 0–150)
VLDL Cholesterol Calculated: 14 mg/dL

## 2017-06-16 LAB — TSH: TSH: 0.06 u[IU]/mL — ABNORMAL LOW (ref 0.27–4.20)

## 2017-06-16 MED ORDER — OMEPRAZOLE 20 MG PO CPDR
20 | ORAL_CAPSULE | Freq: Every day | ORAL | 3 refills | Status: AC | PRN
Start: 2017-06-16 — End: 2017-09-14

## 2017-06-16 MED ORDER — VALSARTAN 80 MG PO TABS
80 | ORAL_TABLET | Freq: Every day | ORAL | 1 refills | Status: AC
Start: 2017-06-16 — End: 2017-09-14

## 2017-06-16 NOTE — Progress Notes (Signed)
Sodium (mmol/L)   Date Value   03/11/2017 136    BUN (mg/dL)   Date Value   16/10/960401/10/2017 12    Glucose (MG/DL)   Date Value   54/09/811904/27/2018 100 (H)      Potassium (mmol/L)   Date Value   03/11/2017 4.7    CREATININE (mg/dL)   Date Value   14/78/295601/10/2017 0.6           BP Readings from Last 2 Encounters:   05/05/17 128/70   03/11/17 124/80       Is patient currently taking any antihypertensive medications?     Yes   If yes, see med list as above    Is the patient reporting any side effects of antihypertensive medications?   No    Is the patient taking any over the counter medications?    No   If yes, see med list as above    Is the patient taking a daily aspirin?    No

## 2017-06-16 NOTE — Progress Notes (Signed)
Chief Complaint   Patient presents with   ??? Medicare AWV     patient is here for an annual wellness visit   ??? 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 Depression     doing well on current medication, feeling much better   ??? Hyperlipidemia     recheck lipids and FBW       HOPI NARRATIVE:    She had self reduced diovan to 1/2 tab daily due to diarrhea side effect.  BP is still excellent today.  Dose is corrected.      She wants to stop her pristiq, she has a bottle of ninety to use to wean down.  She still has a few bad/sad days a week but feels like she wants off the medication.  She wants to reduce to QOD then off over next few month or so.      No issues with stomach problems on medication or thyroid problems.  Requesting labs today, a whole set were done in Jan.  Hyperglycemia with a1c of 6.0.      She continues on a high fat almost zero carb diet with good weight loss and feeling ok, she is curious how her labs will look and so am I.      Patient is established unless otherwise noted.  Above chief complaint and HOPI obtained by this physician provider.     Review of Systems   Constitutional: Negative for activity change, chills, fatigue and fever.   HENT: Negative for congestion.    Respiratory: Negative for cough, chest tightness, shortness of breath and wheezing.    Cardiovascular: Negative for chest pain and leg swelling.   Gastrointestinal: Negative for abdominal pain.   Musculoskeletal: Negative for back pain and myalgias.   Skin: Negative for rash.   Psychiatric/Behavioral: Negative for behavioral problems and dysphoric mood.       Allergies   Allergen Reactions   ??? Morphine Nausea And Vomiting     Allergy historyupdated.    Outpatient Medications Marked as Taking for the 06/16/17 encounter (Office Visit) with Merita NortonPenny Georgian Mcclory, MD   Medication Sig Dispense Refill   ??? valsartan (DIOVAN) 80 MG tablet Take 0.5 tablets by mouth daily Indications:  0.5 only taking 40 mg 90 tablet 1   ??? omeprazole (PRILOSEC) 20 MG delayed release capsule Take 1 capsule by mouth daily as needed 90 capsule 3   ??? atenolol (TENORMIN) 25 MG tablet Take 1 tablet by mouth daily 90 tablet 3   ??? atorvastatin (LIPITOR) 40 MG tablet Take 1 tablet by mouth daily 90 tablet 3   ??? levothyroxine (SYNTHROID) 100 MCG tablet Take 1 tablet by mouth daily 10 tablet 3   ??? ketoconazole (NIZORAL) 2 % cream Apply 1 Applicatorful topically 2 times daily 30 g 5   ??? Cetirizine HCl (ZYRTEC ALLERGY PO) Take 1 tablet by mouth as needed     ??? fluticasone (FLONASE) 50 MCG/ACT nasal spray 1 spray by Nasal route as needed for Rhinitis     ??? DOXYLAMINE-DM PO Take 1 tablet by mouth nightly         Tobacco use history updated.   Social History     Tobacco Use   Smoking Status Former Smoker   ??? Types: Cigarettes   Smokeless Tobacco Never Used        Nursing note reviewed.    Vitals:  06/16/17 0910   BP: 126/70   Site: Left Upper Arm   Position: Sitting   Cuff Size: Large Adult   Pulse: 100   SpO2: 98%   Weight: 191 lb 9.6 oz (86.9 kg)          BP Readings from Last 3 Encounters:   06/16/17 126/70   05/05/17 128/70   03/11/17 124/80     Wt Readings from Last 3 Encounters:   06/16/17 191 lb 9.6 oz (86.9 kg)   05/05/17 202 lb 12.8 oz (92 kg)   03/11/17 212 lb 3.2 oz (96.3 kg)     Body mass index is 31.88 kg/m??.    No results found for this visit on 06/16/17.      Physical Exam   Constitutional: She is oriented to person, place, and time. She appears well-developed and well-nourished. No distress.   HENT:   Head: Normocephalic.   Mouth/Throat: Oropharynx is clear and moist.   Eyes: Pupils are equal, round, and reactive to light. Conjunctivae are normal. Right eye exhibits no discharge. Left eye exhibits no discharge.   Neck: Normal range of motion.   Cardiovascular: Normal rate, regular rhythm and normal heart sounds.   No murmur heard.  Pulmonary/Chest: Effort normal and breath sounds normal. No respiratory distress.  She has no wheezes. She has no rales.   Neurological: She is alert and oriented to person, place, and time.   Skin: Skin is warm and dry. She is not diaphoretic.   Psychiatric: She has a normal mood and affect. Her behavior is normal.   Nursing note and vitals reviewed.          _________________________________________________  Assessment:     Lagina was seen today for medicare awv, hypertension, depression and hyperlipidemia.    Diagnoses and all orders for this visit:    Routine general medical examination at a health care facility    Essential hypertension  -     valsartan (DIOVAN) 80 MG tablet; Take 0.5 tablets by mouth daily Indications: 0.5 only taking 40 mg  -     Comprehensive Metabolic Panel, Fasting    Mixed hyperlipidemia  -     Lipid, Fasting    Thyroid disease  -     TSH without Reflex    Hyperglycemia    Esophagitis  -     omeprazole (PRILOSEC) 20 MG delayed release capsule; Take 1 capsule by mouth daily as needed    Depressive disorder      Problems listed above are stable and therapeutic plan is unchanged unless otherwise specified.  See orders above and comments below for details of workup ormedication orders.          _________________________________________________  Plan:     Has script for shingles vaccine already.      Return in about 6 months (around 12/16/2017), or if symptoms worsen or fail to improve, for recheck HTN and mood, no fbw, pneumonia vaccine.      Electronically signed by Merita Norton, MD on 06/16/17 at 9:24 AM

## 2017-06-16 NOTE — Progress Notes (Signed)
Medicare Annual Wellness Visit  Name: Tiffany Little Today???s Date: 06/16/2017   MRN: [id number] Sex: Female   Age: 72 y.o. 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 These results, as well as other patient data from the Health Risk Assessment form, are documented in Flowsheets linked to this Encounter.    Allergies   Allergen Reactions   ??? Morphine Nausea And Vomiting       Prior to Visit Medications    Medication Sig Taking? Authorizing Provider   valsartan (DIOVAN) 80 MG tablet Take 0.5 tablets by mouth daily Indications: 0.5 only taking 40 mg Yes Merita Norton, MD   omeprazole (PRILOSEC) 20 MG delayed release capsule Take 1 capsule by mouth daily as needed Yes Merita Norton, MD   atenolol (TENORMIN) 25 MG tablet Take 1 tablet by mouth daily Yes Merita Norton, MD   atorvastatin (LIPITOR) 40 MG tablet Take 1 tablet by mouth daily Yes Merita Norton, MD   levothyroxine (SYNTHROID) 100 MCG tablet Take 1 tablet by mouth daily Yes Merita Norton, MD   ketoconazole (NIZORAL) 2 % cream Apply 1 Applicatorful topically 2 times daily Yes Merita Norton, MD   Cetirizine HCl (ZYRTEC ALLERGY PO) Take 1 tablet by mouth as needed Yes Historical Provider, MD   fluticasone (FLONASE) 50 MCG/ACT nasal spray 1 spray by Nasal route as needed for Rhinitis Yes Historical Provider, MD   DOXYLAMINE-DM PO Take 1 tablet by mouth nightly Yes Historical Provider, MD       Past Medical History:   Diagnosis Date   ??? Hyperlipidemia    ??? Hypertension    ??? Thyroid disease      Past Surgical History:   Procedure Laterality Date   ??? BACK  SURGERY  1990    low back    ??? BREAST BIOPSY      1970's or 80's   ??? LUNG SURGERY Right 07/06/2015    Robotic right apical bleb resection   ??? NECK SURGERY  1992       Family History   Problem Relation Age of Onset   ??? Cancer Father         pancreatic cancer    ??? Diabetes Sister    ??? Thyroid Disease Sister         two sister    ??? Diabetes Brother         two brothers       CareTeam (Including outside providers/suppliers regularly involved in providing care):   Patient Care Team:  Merita Norton, MD as PCP - General  Isaiah Serge, MD (Gastroenterology)    Wt Readings from Last 3 Encounters:   06/16/17 191 lb 9.6 oz (86.9 kg)   05/05/17 202 lb 12.8 oz (92 kg)   03/11/17 212 lb 3.2 oz (96.3 kg)     Vitals:    06/16/17 0910   BP: 126/70   Site: Left Upper Arm   Position: Sitting   Cuff Size: Large Adult   Pulse: 100   SpO2: 98%   Weight: 191 lb 9.6 oz (86.9 kg)     Body  mass index is 31.88 kg/m??.    Based upon direct observation of the patient, evaluation of cognition reveals recent and remote memory intact.      Patient's complete Health Risk Assessment and screening values have been reviewed and are found in Flowsheets. The following problems were reviewed today and where indicated follow up appointments were made and/or referrals ordered.    Positive Risk Factor Screenings with Interventions:     General Health:  General  In general, how would you say your health is?: Very Good  In the past 7 days, have you experienced any of the following? New or Increased Pain, New or Increased Fatigue, Loneliness, Social Isolation, Stress or Anger?: None of These  Do you get the social and emotional support that you need?: Yes  Do you have a Living Will?: (!) No  General Health Risk Interventions:  ?? No Living Will: provided the state-specific advance directive document to the patient    Health Habits/Nutrition:  Health Habits/Nutrition  Do you exercise for at least 20 minutes 2-3 times per week?: Yes  Have you lost any weight  without trying in the past 3 months?: No  Do you eat fewer than 2 meals per day?: (!) Yes  Have you seen a dentist within the past year?: Yes  Body mass index is 31.88 kg/m??.  Health Habits/Nutrition Interventions:  ?? continue healthy eating for weight loss and exercise as able    Hearing/Vision:  Hearing/Vision  Do you or your family notice any trouble with your hearing?: (!) Yes  Do you have difficulty driving, watching TV, or doing any of your daily activities because of your eyesight?: No  Have you had an eye exam within the past year?: Yes  Hearing/Vision Interventions:  ?? declines intervention for these    Personalized Preventive Plan   Current Health Maintenance Status  Immunization History   Administered Date(s) Administered   ??? Hepatitis A Adult (Vaqta) 05/05/2017   ??? Influenza, High Dose (Fluzone 65 yrs and older) 11/15/2016   ??? Pneumococcal 13-valent Conjugate (Prevnar13) 11/15/2016   ??? Tdap (Boostrix, Adacel) 03/11/2017        Health Maintenance   Topic Date Due   ??? Shingles Vaccine (1 of 2) 08/18/1995   ??? Pneumococcal 65+ years Vaccine (2 of 2 - PPSV23) 11/15/2017   ??? A1C test (Diabetic or Prediabetic)  03/11/2018   ??? Potassium monitoring  03/11/2018   ??? Creatinine monitoring  03/11/2018   ??? Breast cancer screen  06/29/2018   ??? Colon cancer screen colonoscopy  12/05/2021   ??? Lipid screen  03/11/2022   ??? Hepatitis C screen  03/11/2022   ??? DTaP/Tdap/Td vaccine (2 - Td) 03/12/2027   ??? DEXA (modify frequency per FRAX score)  Completed   ??? Flu vaccine  Completed     Recommendations for Preventive Services Due: see orders and patient instructions/AVS.  .  Recommended screening schedule for the next 5-10 years is provided to the patient in written form: see Patient Instructions/AVS.

## 2017-06-16 NOTE — Patient Instructions (Addendum)
Advance Directives: Care Instructions  Your Care Instructions  An advance directive is a legal way to state your wishes at the end of your life. It tells your family and your doctor what to do if you can no longer say what you want.  There are two main types of advance directives. You can change them any time that your wishes change.  ?? A living will tells your family and your doctor your wishes about life support and other treatment.  ?? A durable power of attorney for health care lets you name a person to make treatment decisions for you when you can't speak for yourself. This person is called a health care agent.  If you do not have an advance directive, decisions about your medical care may be made by a doctor or a judge who doesn't know you.  It may help to think of an advance directive as a gift to the people who care for you. If you have one, they won't have to make tough decisions by themselves.  Follow-up care is a key part of your treatment and safety. Be sure to make and go to all appointments, and call your doctor if you are having problems. It's also a good idea to know your test results and keep a list of the medicines you take.  How can you care for yourself at home?  ?? Discuss your wishes with your loved ones and your doctor. This way, there are no surprises.  ?? Many states have a unique form. Or you might use a universal form that has been approved by many states. This kind of form can sometimes be completed and stored online. Your electronic copy will then be available wherever you have a connection to the Internet. In most cases, doctors will respect your wishes even if you have a form from a different state.  ?? You don't need a lawyer to do an advance directive. But you may want to get legal advice.  ?? Think about these questions when you prepare an advance directive:  ? Who do you want to make decisions about your medical care if you are not able to? Many people choose a family member or  close friend.  ? Do you know enough about life support methods that might be used? If not, talk to your doctor so you understand.  ? What are you most afraid of that might happen? You might be afraid of having pain, losing your independence, or being kept alive by machines.  ? Where would you prefer to die? Choices include your home, a hospital, or a nursing home.  ? Would you like to have information about hospice care to support you and your family?  ? Do you want to donate organs when you die?  ? Do you want certain religious practices performed before you die? If so, put your wishes in the advance directive.  ?? Read your advance directive every year, and make changes as needed.  When should you call for help?  Be sure to contact your doctor if you have any questions.  Where can you learn more?  Go to [email or web address] and sign in to your MyChart account. Enter R264 in the [place] box to learn more about "Advance Directives: Care Instructions."     If you do not have an account, please click on the "Sign Up Now" link.  Current as of: June 19, 2016  Content Version: 11.9  ?? 2006-2018 Healthwise, Incorporated. Care instructions  adapted under license by [facility]. If you have questions about a medical condition or this instruction, always ask your healthcare professional. [place] any warranty or liability for your use of this information.           Learning About Durable Power of Attorney for Health Care  What is a durable power of attorney for health care?    A durable power of attorney for health care is one type of the legal forms called advance directives. It lets you decide who you want to make treatment decisions for you if you cannot speak or decide for yourself. The person you choose is called your health care agent.  Another type of advance directive is a living will. It lets you write down what kinds of treatment or life support you  want or do not want.  What should you think about when choosing a health care agent?  Choose your health care agent carefully. This person may or may not be a family member.  Talk to the person before you make your final decision. Make sure he or she is comfortable with this responsibility.  It's a good idea to choose someone who:  ?? Is at least 72 years old.  ?? Knows you well and understands what makes life meaningful for you.  ?? Understands your religious and moral values.  ?? Will do what you want, not what he or she wants.  ?? Will be able to make difficult choices at a stressful time.  ?? Will be able to refuse or stop treatment, if that is what you would want, even if you could die.  ?? Will be firm and confident with health professionals if needed.  ?? Will ask questions to get necessary information.  ?? Lives near you or agrees to travel to you if needed.  Your family may help you make medical decisions while you can still be part of that process. But it is important to choose one person to be your health care agent in case you are not able to make decisions for yourself.  If you don't fill out the legal form and name a health care agent, the decisions your family can make may be limited.  Who will make decisions for you if you do not have a health care agent?  If you don't have a health care agent or a living will, your family members may disagree about your medical care. And then some medical professionals who may not know you as well might have to make decisions for you. In some cases, a judge makes the decisions.  When you name a health care agent, it is very clear who has the power to make health decisions for you.  How do you name a health care agent?  You name your health care agent on a legal form. It is usually called a durable power of attorney for health care. Ask your hospital, state bar association, or office on aging where to find these forms.  You must sign the form to make it legal. Some states  require you to get the form notarized. This means that a person called a notary public watches you sign the form and then he or she signs the form. Some states also require that two or more witnesses sign the form.  Be sure to tell your family members and doctors who your health care agent is.  Keep your forms in a safe place. But make sure that your  loved ones know where the forms are. This could be in your desk where you keep other important papers. Make sure your doctor has a copy of your forms.  Where can you learn more?  Go to [email or web address] and sign in to your MyChart account. Enter P737 in the [place] box to learn more about "Learning About Durable Power of Attorney for Health Care."     If you do not have an account, please click on the "Sign Up Now" link.  Current as of: June 19, 2016  Content Version: 11.9  ?? 2006-2018 Healthwise, Incorporated. Care instructions adapted under license by [facility]. If you have questions about a medical condition or this instruction, always ask your healthcare professional. [place] any warranty or liability for your use of this information.           Learning About Living Eugenie Birks  What is a living will?    A living will is a legal form you use to write down the kind of care you want at the end of your life. It is used by the health professionals who will treat you if you aren't able to decide for yourself.  If you put your wishes in writing, your loved ones and others will know what kind of care you want. They won't need to guess. This can ease your mind and be helpful to others.  A living will is not the same as an estate or property will. An estate will explains what you want to happen with your money and property after you die.  Is a living will a legal document?  A living will is a legal document. Each state has its own laws about living wills. If you move to another state, make sure that your living  will is legal in the state where you now live. Or you might use a universal form that has been approved by many states. This kind of form can sometimes be completed and stored online. Your electronic copy will then be available wherever you have a connection to the Internet. In most cases, doctors will respect your wishes even if you have a form from a different state.  ?? You don't need an attorney to complete a living will. But legal advice can be helpful if your state's laws are unclear, your health history is complicated, or your family can't agree on what should be in your living will.  ?? You can change your living will at any time. Some people find that their wishes about end-of-life care change as their health changes.  ?? In addition to making a living will, think about completing a medical power of attorney form. This form lets you name the person you want to make end-of-life treatment decisions for you (your "health care agent") if you're not able to. Many hospitals and nursing homes will give you the forms you need to complete a living will and a medical power of attorney.  ?? Your living will is used only if you can't make or communicate decisions for yourself anymore. If you become able to make decisions again, you can accept or refuse any treatment, no matter what you wrote in your living will.  ?? Your state may offer an online registry. This is a place where you can store your living will online so the doctors and nurses who need to treat you can find it right away.  What should you think about when creating a living will?  Talk about your end-of-life wishes with your family members and your doctor. Let them know what you want. That way the people making decisions for you won't be surprised by your choices.  Think about these questions as you make your living will:  ?? Do you know enough about life support methods that might be used? If not, talk to your doctor so you know what might be done if you can't  breathe on your own, your heart stops, or you're unable to swallow.  ?? What things would you still want to be able to do after you receive life-support methods? Would you want to be able to walk? To speak? To eat on your own? To live without the help of machines?  ?? If you have a choice, where do you want to be cared for? In your home? At a hospital or nursing home?  ?? Do you want certain religious practices performed if you become very ill?  ?? If you have a choice at the end of your life, where would you prefer to die? At home? In a hospital or nursing home? Somewhere else?  ?? Would you prefer to be buried or cremated?  ?? Do you want your organs to be donated after you die?  What should you do with your living will?  ?? Make sure that your family members and your health care agent have copies of your living will.  ?? Give your doctor a copy of your living will to keep in your medical record. If you have more than one doctor, make sure that each one has a copy.  ?? You may want to put a copy of your living will where it can be easily found.  Where can you learn more?  Go to [email or web address] and sign in to your MyChart account. Enter [phone] in the [place] box to learn more about "[place]."     If you do not have an account, please click on the "Sign Up Now" link.  Current as of: June 19, 2016  Content Version: 11.9  ?? 2006-2018 Healthwise, Incorporated. Care instructions adapted under license by [facility]. If you have questions about a medical condition or this instruction, always ask your healthcare professional. [place] any warranty or liability for your use of this information.           Eating Healthy Foods: Care Instructions  Your Care Instructions    Eating healthy foods can help lower your risk for disease. Healthy food gives you energy and keeps your heart strong, your brain active, your muscles working, and your bones  strong.  A healthy diet includes a variety of foods from the basic food groups: grains, vegetables, fruits, milk and milk products, and meat and beans. Some people may eat more of their favorite foods from only one food group and, as a result, miss getting the nutrients they need. So, it is important to pay attention not only to what you eat but also to what you are missing from your diet. You can eat a healthy, balanced diet by making a few small changes.  Follow-up care is a key part of your treatment and safety. Be sure to make and go to all appointments, and call your doctor if you are having problems. It's also a good idea to know your test results and keep a list of the medicines you take.  How can you care for yourself at home?  Look at what  you eat  ?? Keep a food diary for a week or two and record everything you eat or drink. Track the number of servings you eat from each food group.  ?? For a balanced diet every day, eat a variety of:  ? 6 or more ounce-equivalents of grains, such as cereals, breads, crackers, rice, or pasta, every day. An ounce-equivalent is 1 slice of bread, 1 cup of ready-to-eat cereal, or ?? cup of cooked rice, cooked pasta, or cooked cereal.  ? 2?? cups of vegetables, especially:  ?? Dark-green vegetables such as broccoli and spinach.  ?? Orange vegetables such as carrots and sweet potatoes.  ?? Dry beans (such as pinto and kidney beans) and peas (such as lentils).  ? 2 cups of fresh, frozen, or canned fruit. A small apple or 1 banana or orange equals 1 cup.  ? 3 cups of nonfat or low-fat milk, yogurt, or other milk products.  ? 5?? ounces of meat and beans, such as chicken, fish, lean meat, beans, nuts, and seeds. One egg, 1 tablespoon of peanut butter, ?? ounce nuts or seeds, or ?? cup of cooked beans equals 1 ounce of meat.  ?? Learn how to read food labels for serving sizes and ingredients. Fast-food and convenience-food meals often contain few or no fruits or vegetables. Make sure you eat  some fruits and vegetables to make the meal more nutritious.  ?? Look at your food diary. For each food group, add up what you have eaten and then divide the total by the number of days. This will give you an idea of how much you are eating from each food group. See if you can find some ways to change your diet to make it more healthy.  Start small  ?? Do not try to make dramatic changes to your diet all at once. You might feel that you are missing out on your favorite foods and then be more likely to fail.  ?? Start slowly, and gradually change your habits. Try some of the following:  ? Use whole wheat bread instead of white bread.  ? Use nonfat or low-fat milk instead of whole milk.  ? Eat brown rice instead of white rice, and eat whole wheat pasta instead of white-flour pasta.  ? Try low-fat cheeses and low-fat yogurt.  ? Add more fruits and vegetables to meals and have them for snacks.  ? Add lettuce, tomato, cucumber, and onion to sandwiches.  ? Add fruit to yogurt and cereal.  Enjoy food  ?? You can still eat your favorite foods. You just may need to eat less of them. If your favorite foods are high in fat, salt, and sugar, limit how often you eat them, but do not cut them out entirely.  ?? Eat a wide variety of foods.  Make healthy choices when eating out  ?? The type of restaurant you choose can help you make healthy choices. Even fast-food chains are now offering more low-fat or healthier choices on the menu.  ?? Choose smaller portions, or take half of your meal home.  ?? When eating out, try:  ? A veggie pizza with a whole wheat crust or grilled chicken (instead of sausage or pepperoni).  ? Pasta with roasted vegetables, grilled chicken, or marinara sauce instead of cream sauce.  ? A vegetable wrap or grilled chicken wrap.  ? Broiled or poached food instead of fried or breaded items.  Make healthy choices easy  ?? Buy packaged, prewashed,  ready-to-eat fresh vegetables and fruits, such as baby carrots, salad mixes,  and chopped or shredded broccoli and cauliflower.  ?? Buy packaged, presliced fruits, such as melon or pineapple.  ?? Choose 100% fruit or vegetable juice instead of soda. Limit juice intake to 4 to 6 oz (?? to ?? cup) a day.  ?? Blend low-fat yogurt, fruit juice, and canned or frozen fruit to make a smoothie for breakfast or a snack.  Where can you learn more?  Go to [email or web address] and sign in to your MyChart account. Enter [phone] in the Search Health Information box to learn more about "Eating Healthy Foods: Care Instructions."     If you do not have an account, please click on the "Sign Up Now" link.  Current as of: May 29, 2016  Content Version: 11.9  ?? 2006-2018 Healthwise, Incorporated. Care instructions adapted under license by [facility]. If you have questions about a medical condition or this instruction, always ask your healthcare professional. Healthwise, Incorporated disclaims any warranty or liability for your use of this information.      Personalized Preventive Plan for Tiffany DoppSusan M Little - 06/16/2017  Medicare offers a range of preventive health benefits. Some of the tests and screenings are paid in full while other may be subject to a deductible, co-insurance, and/or copay.    Some of these benefits include a comprehensive review of your medical history including lifestyle, illnesses that may run in your family, and various assessments and screenings as appropriate.    After reviewing your medical record and screening and assessments performed today your provider may have ordered immunizations, labs, imaging, and/or referrals for you.  A list of these orders (if applicable) as well as your Preventive Care list are included within your After Visit Summary for your review.    Other Preventive Recommendations:    ?? A preventive eye exam performed by an eye specialist is recommended every 1-2 years to screen for glaucoma; cataracts, macular degeneration, and other eye disorders.  ?? A  preventive dental visit is recommended every 6 months.  ?? Try to get at least 150 minutes of exercise per week or 10,000 steps per day on a pedometer .  ?? Order or download the FREE "Exercise & Physical Activity: Your Everyday Guide" from [employer]. Call [phone] or search [employer] online.  ?? You need 1200-1500 mg of calcium and 1000-2000 IU of vitamin D per day. It is possible to meet your calcium requirement with diet alone, but a vitamin D supplement is usually necessary to meet this goal.  ?? When exposed to the sun, use a sunscreen that protects against both UVA and UVB radiation with an SPF of 30 or greater. Reapply every 2 to 3 hours or after sweating, drying off with a towel, or swimming.  ?? Always wear a seat belt when traveling in a car. Always wear a helmet when riding a bicycle or motorcycle.  Personalized Preventive Plan for Tiffany DoppSusan M Little - 06/16/2017  Medicare offers a range of preventive health benefits. Some of the tests and screenings are paid in full while other may be subject to a deductible, co-insurance, and/or copay.    Some of these benefits include a comprehensive review of your medical history including lifestyle, illnesses that may run in your family, and various assessments and screenings as appropriate.    After reviewing your medical record and screening and assessments performed today your provider may have ordered immunizations,  labs, imaging, and/or referrals for you.  A list of these orders (if applicable) as well as your Preventive Care list are included within your After Visit Summary for your review.    Other Preventive Recommendations:    A preventive eye exam performed by an eye specialist is recommended every 1-2 years to screen for glaucoma; cataracts, macular degeneration, and other eye disorders.  A preventive dental visit is recommended every 6 months.  Try to get at least 150 minutes of exercise per week or 10,000 steps per  day on a pedometer .  Order or download the FREE "Exercise & Physical Activity: Your Everyday Guide" from [employer]. Call [phone] or search [employer] online.  You need 1200-1500 mg of calcium and 1000-2000 IU of vitamin D per day. It is possible to meet your calcium requirement with diet alone, but a vitamin D supplement is usually necessary to meet this goal.  When exposed to the sun, use a sunscreen that protects against both UVA and UVB radiation with an SPF of 30 or greater. Reapply every 2 to 3 hours or after sweating, drying off with a towel, or swimming.  Always wear a seat belt when traveling in a car. Always wear a helmet when riding a bicycle or motorcycle.

## 2017-07-01 LAB — BASIC METABOLIC PANEL
Anion Gap: 9 mmol/L (ref 7–16)
BUN: 12 mg/dL (ref 7–18)
CO2: 22 mmol/L (ref 21–32)
Calcium: 9.2 mg/dL (ref 8.5–10.1)
Chloride: 105 mmol/L (ref 98–107)
Creatinine + eGFR Panel: 0.6 mg/dL (ref 0.6–1.30)
GFR African American: >60 mL/min/{1.73_m2} (ref >60)
GFR Non-African American: >60 mL/min/{1.73_m2} (ref >60)
Glucose: 101 mg/dL (ref 74–106)
Potassium: 3.9 MMOL/L (ref 3.5–5.1)
Sodium: 136 mmol/L (ref 136–145)

## 2017-07-01 LAB — CBC
Hematocrit: 43 % (ref 35.8–46.5)
Hemoglobin: 14.4 g/dL (ref 12.1–15.8)
MCH: 32.3 pg (ref 28.4–33.4)
MCHC: 33.5 g/dL (ref 31.1–37.0)
MCV: 96.5 fl (ref 85.0–99.0)
Platelets: 230 10*3/uL (ref 154–393)
RBC: 4.46 M/uL (ref 3.86–5.17)
RDW: 13.7 % (ref 11.7–15.2)
WBC: 6 10*3/uL (ref 4.0–10.5)

## 2017-07-07 LAB — SURGICAL PATHOLOGY
Gross description:: 20
Gross description:: 21
Gross description:: 4.5
Gross description:: 5
Note:: NEGATIVE

## 2017-09-02 ENCOUNTER — Encounter: Attending: Family Medicine | Primary: Family Medicine

## 2017-09-17 ENCOUNTER — Encounter

## 2017-09-17 MED ORDER — DESVENLAFAXINE SUCCINATE ER 25 MG PO TB24
25 MG | ORAL_TABLET | Freq: Every day | ORAL | 1 refills | Status: DC
Start: 2017-09-17 — End: 2018-03-13

## 2017-09-17 NOTE — Telephone Encounter (Signed)
From: Paschal Dopp  To: Merita Norton, MD  Sent: 09/17/2017 9:35 AM EDT  Subject: Prescription Question    I am in [place] I ran out of Pristique the generic.Marland KitchenMarland KitchenI ordered from [facility] but it won't be here for another week.Marland KitchenMarland KitchenMarland KitchenI am feeling really scrambled and weird...can you please call in 10 day or 30 day to [id number] [phone] Thank you Darl Pikes

## 2017-09-17 NOTE — Telephone Encounter (Signed)
[place] been [place] her Pristiq and shes having a very hard time/reacation to not having it.

## 2017-09-29 ENCOUNTER — Encounter

## 2017-11-04 ENCOUNTER — Inpatient Hospital Stay: Admit: 2017-11-04 | Payer: MEDICARE | Primary: Family Medicine

## 2017-11-04 DIAGNOSIS — Z720 Tobacco use: Secondary | ICD-10-CM

## 2017-11-10 MED ORDER — LEVOTHYROXINE SODIUM 100 MCG PO TABS
100 MCG | ORAL_TABLET | Freq: Every day | ORAL | 2 refills | Status: DC
Start: 2017-11-10 — End: 2017-11-24

## 2017-11-10 NOTE — Telephone Encounter (Signed)
From: Paschal Dopp  To: Merita Norton, MD  Sent: 11/10/2017 9:07 AM EDT  Subject: Prescription Question    I have left my levothyroxine 100 MCG tablet in [place]....could you please call me in 30 at [facility]. My appointment isn't till the end of the month thank you once again Tiffany Little

## 2017-11-12 ENCOUNTER — Ambulatory Visit
Admit: 2017-11-12 | Discharge: 2017-11-12 | Payer: MEDICARE | Attending: Thoracic Surgery (Cardiothoracic Vascular Surgery) | Primary: Family Medicine

## 2017-11-12 DIAGNOSIS — Z87891 Personal history of nicotine dependence: Secondary | ICD-10-CM

## 2017-11-12 NOTE — Progress Notes (Signed)
Tiffany Little   [birth year]   72 y.o.  female    Chief Complaint   Patient presents with   ??? Results     CT Chest         HPI:  Patient is being seen today to discuss 1 year f/u CT Chest results done on 11/04/17.     Pt is doing very well    She is having palpitations and fluttering.  EKG normal  She did have a breast reduction    She has coughs in the morning with yellow sputum     No SOB  No CP    She bought a house in [place] and is tentatively planning on moving there.    Overall the patient feels well.  Her pain at her previous incision site has all healed.  She has no lower extremity edema.    Past Medical History:   Diagnosis Date   ??? Hyperlipidemia    ??? Hypertension    ??? Thyroid disease       Past Surgical History:   Procedure Laterality Date   ??? BACK SURGERY  1990    low back    ??? BREAST BIOPSY      1970's or 80's   ??? LUNG SURGERY Right 07/06/2015    Robotic right apical bleb resection   ??? NECK SURGERY  1992     Allergies   Allergen Reactions   ??? Morphine Nausea And Vomiting     Current Outpatient Medications   Medication Sig Dispense Refill   ??? levothyroxine (SYNTHROID) 100 MCG tablet Take 1 tablet by mouth daily 30 tablet 2   ??? desvenlafaxine succinate (PRISTIQ) 25 MG TB24 extended release tablet Take 1 tablet by mouth daily 30 tablet 1   ??? valsartan (DIOVAN) 80 MG tablet Take 0.5 tablets by mouth daily Indications: 0.5 only taking 40 mg 90 tablet 1   ??? omeprazole (PRILOSEC) 20 MG delayed release capsule Take 1 capsule by mouth daily as needed 90 capsule 3   ??? atenolol (TENORMIN) 25 MG tablet Take 1 tablet by mouth daily 90 tablet 3   ??? atorvastatin (LIPITOR) 40 MG tablet Take 1 tablet by mouth daily 90 tablet 3   ??? ketoconazole (NIZORAL) 2 % cream Apply 1 Applicatorful topically 2 times daily 30 g 5   ??? Cetirizine HCl (ZYRTEC ALLERGY PO) Take 1 tablet by mouth as needed     ??? fluticasone (FLONASE) 50 MCG/ACT nasal spray 1 spray by Nasal route as needed for Rhinitis     ??? DOXYLAMINE-DM PO Take 1 tablet by  mouth nightly       No current facility-administered medications for this visit.       Family History   Problem Relation Age of Onset   ??? Cancer Father         pancreatic cancer    ??? Diabetes Sister    ??? Thyroid Disease Sister         two sister    ??? Diabetes Brother         two brothers     Social History     Socioeconomic History   ??? Marital status: Widowed     Spouse name: Not on file   ??? Number of children: Not on file   ??? Years of education: Not on file   ??? Highest education level: Not on file   Occupational History   ??? Not on file   Social Needs   ???  Financial resource strain: Not on file   ??? Food insecurity:     Worry: Not on file     Inability: Not on file   ??? Transportation needs:     Medical: Not on file     Non-medical: Not on file   Tobacco Use   ??? Smoking status: Former Smoker     Types: Cigarettes   ??? Smokeless tobacco: Never Used   Substance and Sexual Activity   ??? Alcohol use: No   ??? Drug use: No   ??? Sexual activity: Not on file   Lifestyle   ??? Physical activity:     Days per week: Not on file     Minutes per session: Not on file   ??? Stress: Not on file   Relationships   ??? Social connections:     Talks on phone: Not on file     Gets together: Not on file     Attends religious service: Not on file     Active member of club or organization: Not on file     Attends meetings of clubs or organizations: Not on file     Relationship status: Not on file   ??? Intimate partner violence:     Fear of current or ex partner: Not on file     Emotionally abused: Not on file     Physically abused: Not on file     Forced sexual activity: Not on file   Other Topics Concern   ??? Not on file   Social History Narrative   ??? Not on file       Reviewof Systems:       Review of Systems   All other systems reviewed and are negative.    10 point review of systems performed with all pertinent positives noted in HPI    Physical Exam:  BP 132/80    Pulse 67    Ht 5\' 5"  (1.651 m)    Wt 173 lb (78.5 kg)    BMI 28.79 kg/m??   Physical  Exam   CONSTITUTIONAL:  awake, alert, cooperative, no apparent distress, and appears stated age  NECK:  Supple, symmetrical, trachea midline, no adenopathy, thyroid symmetric, not enlarged and no tenderness, skin normal  HEMATOLOGIC/LYMPHATICS:  no cervical lymphadenopathy and no supraclavicular lymphadenopathy  LUNGS:  No increased work of breathing, good air exchange, clear to auscultation bilaterally, no crackles or wheezing  CARDIOVASCULAR:  Normal apical impulse, regular rate and rhythm, normal S1 and S2, no S3 or S4, and no murmur noted  CHEST/BREASTS:  symmetric  ABDOMEN: soft nt nd, no pulsitile masses  MUSCULOSKELETAL:  There is no redness, warmth, or swelling of the joints.  Full range of motion noted.  Motor strength is 5 out of 5 all extremities bilaterally.  Tone is normal.  NEUROLOGIC:  Awake, alert, oriented to name, place and time.  Cranial nerves II-XII are grossly intact.  Motor is 5 out of 5 bilaterally.     SKIN:  no bruising or bleeding and normal skin color, texture, turgor  VASC: 1+ femoral pulses        Diagnostic Testing:       CT - no significant abnormalities    Assessment:        Problem List Items Addressed This Visit     Ex-cigarette smoker - Primary    Mediastinal adenopathy          Plan:    No need for further follow  up    There is no mediastinal adenopathy on the CT scan.  No need for any further intervention or biopsy.  This has remained completely stable for multiple CT scans.    Follow-up as needed.  Return if symptoms worsen or fail to improve.

## 2017-11-24 ENCOUNTER — Ambulatory Visit: Admit: 2017-11-24 | Discharge: 2017-11-24 | Payer: MEDICARE | Attending: Family Medicine | Primary: Family Medicine

## 2017-11-24 DIAGNOSIS — E039 Hypothyroidism, unspecified: Secondary | ICD-10-CM

## 2017-11-24 MED ORDER — LEVOTHYROXINE SODIUM 75 MCG PO TABS
75 MCG | ORAL_TABLET | Freq: Every day | ORAL | 3 refills | Status: AC
Start: 2017-11-24 — End: 2018-02-22

## 2017-11-24 NOTE — Progress Notes (Signed)
Tiffany DoppSusan M Ungaro  1945/10/24  11/25/17    Chief Complaint   Patient presents with   . Other     Patient states having side effects from thyroid medication. Patient states having night sweats, cold feet, and bruising           Patient here for f/u visit regarding her thyroid medication, her blood work still show her TSH is low, patient stats having heart racing face and her skin bruise easily, denies chest pain or SOB.      Past Medical History:   Diagnosis Date   . Hyperlipidemia    . Hypertension    . Thyroid disease      Past Surgical History:   Procedure Laterality Date   . BACK SURGERY  1990    low back    . BREAST BIOPSY      1970's or 80's   . LUNG SURGERY Right 07/06/2015    Robotic right apical bleb resection   . NECK SURGERY  1992     Family History   Problem Relation Age of Onset   . Cancer Father         pancreatic cancer    . Diabetes Sister    . Thyroid Disease Sister         two sister    . Diabetes Brother         two brothers     Social History     Socioeconomic History   . Marital status: Widowed     Spouse name: Not on file   . Number of children: Not on file   . Years of education: Not on file   . Highest education level: Not on file   Occupational History   . Not on file   Social Needs   . Financial resource strain: Not on file   . Food insecurity:     Worry: Not on file     Inability: Not on file   . Transportation needs:     Medical: Not on file     Non-medical: Not on file   Tobacco Use   . Smoking status: Former Smoker     Types: Cigarettes   . Smokeless tobacco: Never Used   Substance and Sexual Activity   . Alcohol use: No   . Drug use: No   . Sexual activity: Not on file   Lifestyle   . Physical activity:     Days per week: Not on file     Minutes per session: Not on file   . Stress: Not on file   Relationships   . Social connections:     Talks on phone: Not on file     Gets together: Not on file     Attends religious service: Not on file     Active member of club or organization: Not on file      Attends meetings of clubs or organizations: Not on file     Relationship status: Not on file   . Intimate partner violence:     Fear of current or ex partner: Not on file     Emotionally abused: Not on file     Physically abused: Not on file     Forced sexual activity: Not on file   Other Topics Concern   . Not on file   Social History Narrative   . Not on file       Allergies   Allergen Reactions   .  Morphine Nausea And Vomiting     Current Outpatient Medications   Medication Sig Dispense Refill   . levothyroxine (SYNTHROID) 75 MCG tablet Take 1 tablet by mouth daily 90 tablet 3   . desvenlafaxine succinate (PRISTIQ) 25 MG TB24 extended release tablet Take 1 tablet by mouth daily 30 tablet 1   . ketoconazole (NIZORAL) 2 % cream Apply 1 Applicatorful topically 2 times daily 30 g 5   . Cetirizine HCl (ZYRTEC ALLERGY PO) Take 1 tablet by mouth as needed     . fluticasone (FLONASE) 50 MCG/ACT nasal spray 1 spray by Nasal route as needed for Rhinitis     . DOXYLAMINE-DM PO Take 1 tablet by mouth nightly     . valsartan (DIOVAN) 80 MG tablet Take 0.5 tablets by mouth daily Indications: 0.5 only taking 40 mg 90 tablet 1   . omeprazole (PRILOSEC) 20 MG delayed release capsule Take 1 capsule by mouth daily as needed 90 capsule 3   . atenolol (TENORMIN) 25 MG tablet Take 1 tablet by mouth daily 90 tablet 3   . atorvastatin (LIPITOR) 40 MG tablet Take 1 tablet by mouth daily 90 tablet 3     No current facility-administered medications for this visit.        Review of Systems   Constitutional: Negative for activity change, appetite change, chills, diaphoresis and fever.   Respiratory: Negative for cough and shortness of breath.    Cardiovascular: Positive for palpitations. Negative for chest pain and leg swelling.   Neurological: Negative for dizziness and headaches.   Psychiatric/Behavioral: Negative for agitation and sleep disturbance. The patient is not nervous/anxious.        Lab Results   Component Value Date    WBC  6.0 07/01/2017    HGB 14.4 07/01/2017    HCT 43.0 07/01/2017    MCV 96.5 07/01/2017    PLT 230 07/01/2017     Lab Results   Component Value Date    NA 136 07/01/2017    K 3.9 07/01/2017    CL 105 07/01/2017    CO2 22 07/01/2017    BUN 12 07/01/2017    CREATININE 0.5 (L) 06/16/2017    GLUCOSE 101 07/01/2017    CALCIUM 9.2 07/01/2017    PROT 7.2 06/16/2017    LABALBU 4.4 06/16/2017    BILITOT 0.3 06/16/2017    ALKPHOS 79 06/16/2017    AST 26 06/16/2017    ALT 33 06/16/2017    LABGLOM > 60 07/01/2017    GFRAA > 60 07/01/2017    AGRATIO 1.6 06/16/2017    GLOB 2.8 06/16/2017     Lab Results   Component Value Date    CHOL 168 06/28/2016    CHOL 269 (H) 04/18/2014     Lab Results   Component Value Date    TRIG 89 06/28/2016    TRIG 104 04/18/2014     Lab Results   Component Value Date    HDL 60 06/16/2017    HDL 54 03/11/2017    HDL 57 06/28/2016     Lab Results   Component Value Date    LDLCALC 93 06/16/2017    LDLCALC 105 (H) 03/11/2017     Lab Results   Component Value Date    LABA1C 6.0 03/11/2017     Lab Results   Component Value Date    TSH 0.06 (L) 06/16/2017    TSHHS 0.227 (L) 06/28/2016         BP 122/84 (  Site: Left Upper Arm, Position: Sitting, Cuff Size: Medium Adult)   Pulse 71   Wt 176 lb 6.4 oz (80 kg)   SpO2 96%   BMI 29.35 kg/m     BP Readings from Last 3 Encounters:   11/24/17 122/84   11/12/17 132/80   06/16/17 126/70       Wt Readings from Last 3 Encounters:   11/24/17 176 lb 6.4 oz (80 kg)   11/12/17 173 lb (78.5 kg)   06/16/17 191 lb 9.6 oz (86.9 kg)         Physical Exam   Constitutional: She is oriented to person, place, and time. She appears well-developed and well-nourished. No distress.   Neck: Normal range of motion. Neck supple. Thyromegaly (??) present.   Cardiovascular: Normal rate, regular rhythm and normal heart sounds.   No murmur heard.  Pulmonary/Chest: Effort normal. She has no wheezes. She has no rales.   Musculoskeletal: Normal range of motion. She exhibits no edema.    Lymphadenopathy:     She has no cervical adenopathy.   Neurological: She is alert and oriented to person, place, and time.   Skin: Skin is warm and dry. She is not diaphoretic.   Psychiatric: She has a normal mood and affect. Her behavior is normal.       ASSESSMENT/ PLAN:    1. Acquired hypothyroidism  - TSH still low, so will decrease :  - levothyroxine (SYNTHROID) 75 MCG tablet; Take 1 tablet by mouth daily  Dispense: 90 tablet; Refill: 3  - [place] HEAD NECK SOFT TISSUE THYROID; Future    2. Essential hypertension  - stable                - Appropriateprescription are addressed.  - After visit summery provided.  - Questions answered and patient verbalizes understanding.  - Call for any problem, questions, or concerns.        Return in about 2 months (around 01/24/2018).

## 2017-11-26 ENCOUNTER — Inpatient Hospital Stay: Admit: 2017-11-26 | Payer: MEDICARE | Primary: Family Medicine

## 2017-11-26 DIAGNOSIS — E039 Hypothyroidism, unspecified: Secondary | ICD-10-CM

## 2017-12-16 ENCOUNTER — Encounter: Attending: Family Medicine | Primary: Family Medicine

## 2017-12-29 ENCOUNTER — Encounter: Attending: Family Medicine | Primary: Family Medicine

## 2018-01-26 ENCOUNTER — Encounter: Attending: Family Medicine | Primary: Family Medicine

## 2018-03-13 ENCOUNTER — Encounter

## 2018-03-13 MED ORDER — ATENOLOL 25 MG PO TABS
25 MG | ORAL_TABLET | ORAL | 1 refills | Status: AC
Start: 2018-03-13 — End: ?

## 2018-03-13 MED ORDER — DESVENLAFAXINE SUCCINATE ER 25 MG PO TB24
25 MG | ORAL_TABLET | ORAL | 1 refills | Status: AC
Start: 2018-03-13 — End: ?

## 2018-03-13 MED ORDER — ATORVASTATIN CALCIUM 40 MG PO TABS
40 MG | ORAL_TABLET | ORAL | 1 refills | Status: AC
Start: 2018-03-13 — End: ?

## 2018-09-14 IMAGING — MG MAMMOGRAPHY DIAGNOSTIC BILATERAL 3D TOMOSYNTHESIS WITH CAD
8 series · 8 of 24 positions shown · non-contrast
Comparison: None 
BREAST DENSITY: (Level B) There are scattered areas of fibroglandular density.

******** ADDENDUM #1 ********/n 
Addendum 09/24/2018 
Comparison exam from 4582 and 0647 now available 
The breast reduction surgery has been since the most recent comparisons. This 
exam will help serve as a new baseline going forward. 
BI-RADS 2. Benign findings. 
REPORT ******** 
MAMMOGRAPHY DIAGNOSTIC BILATERAL 3D TOMOSYNTHESIS WITH CAD, BREAST ULTRASOUND 
BILATERAL COMPLETE, 09/14/2018 [DATE]: 
CLINICAL INDICATION: Dense breast. History of bilateral breast reduction 
surgery.
TECHNIQUE: Digital bilateral mammograms and 3-D Tomosynthesis were obtained. 
These were interpreted both primarily and with the aid of computer-aided 
detection system. Bilateral breast sonography was also performed.

[L MLO]
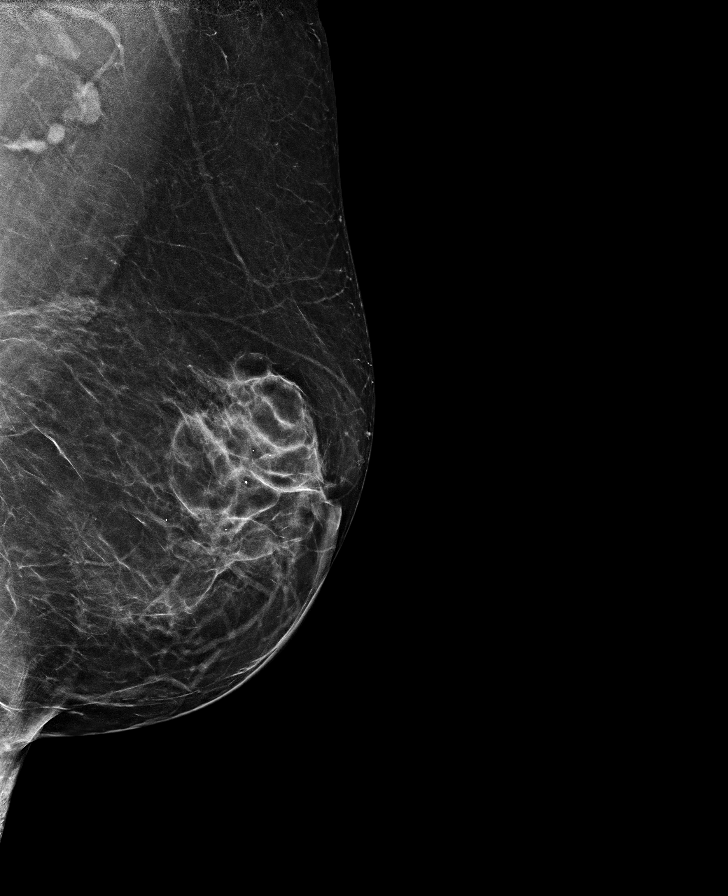

[R CC]
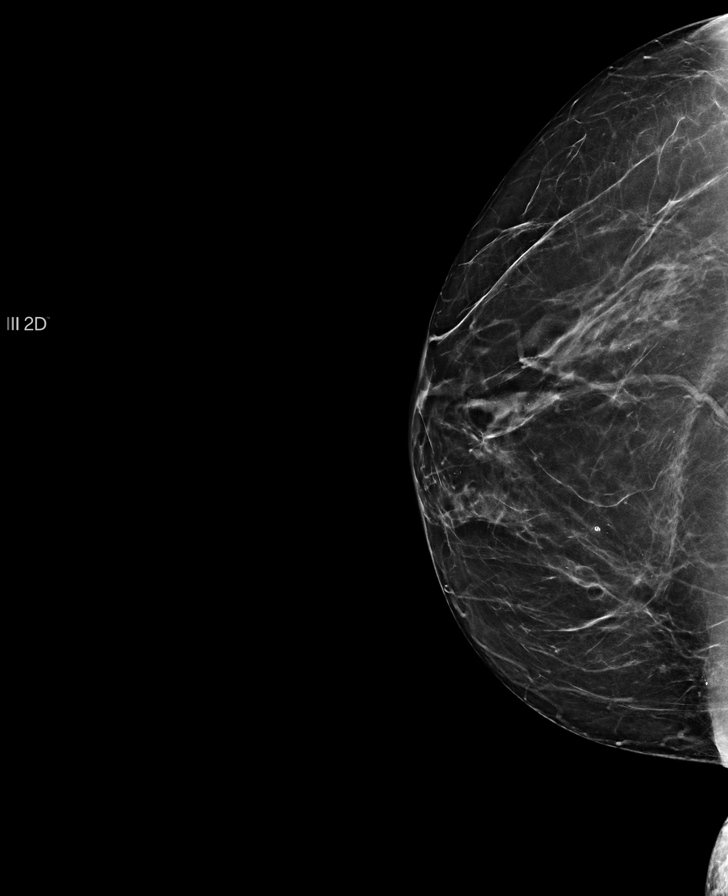

[R MLO]
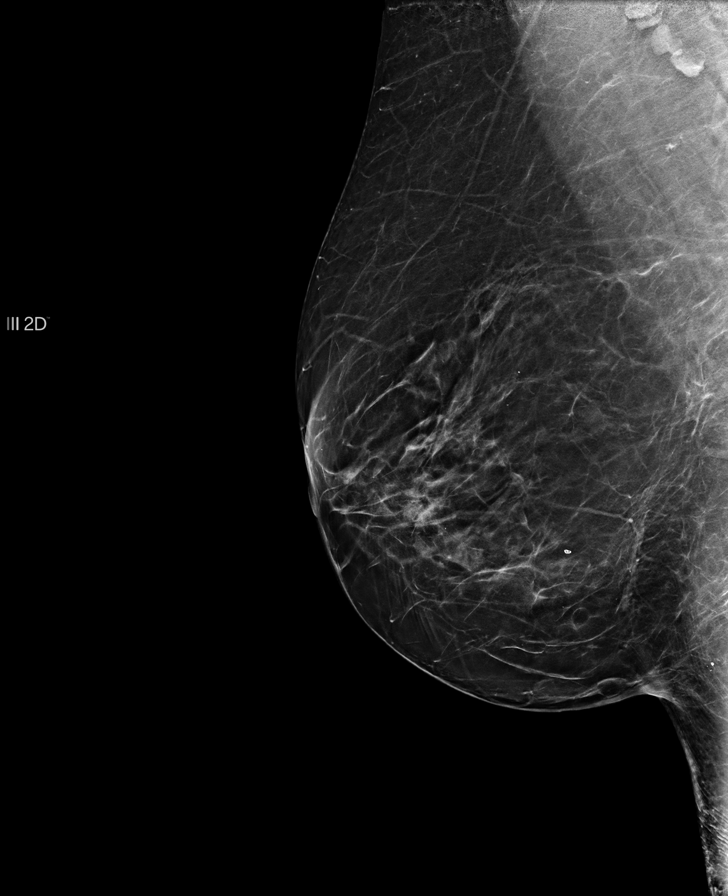

[L CC]
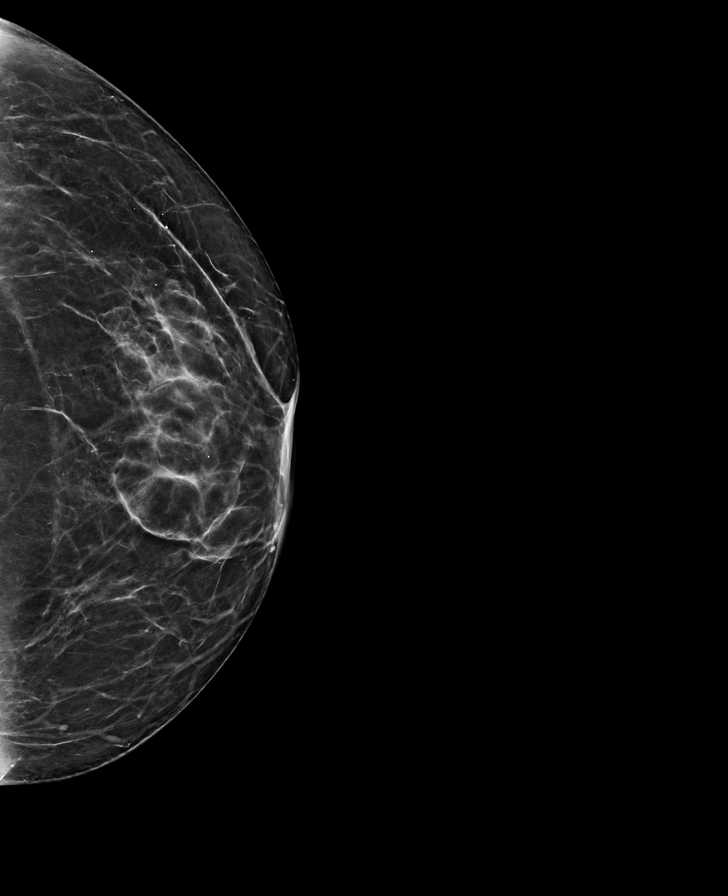

[R CC tomo · tomo slice 37/72.0]
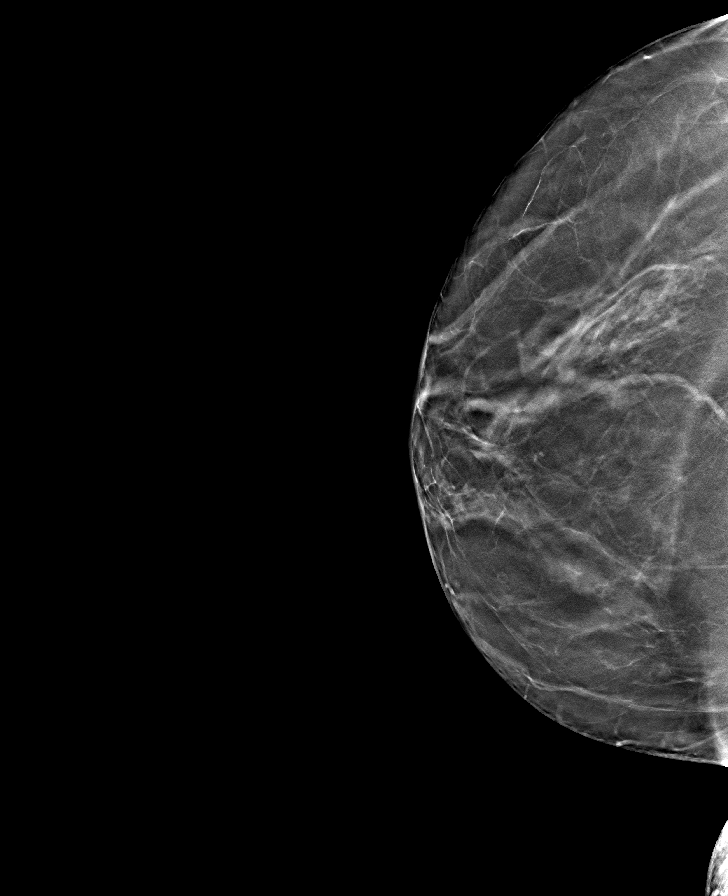

[L MLO tomo · tomo slice 40/79.0]
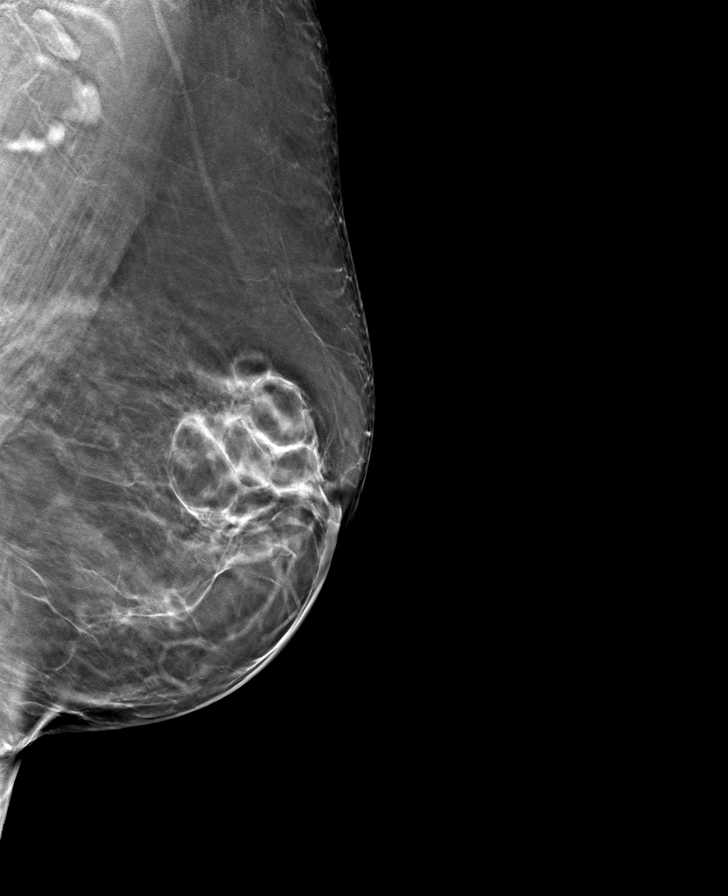

[L CC tomo · tomo slice 36/71.0]
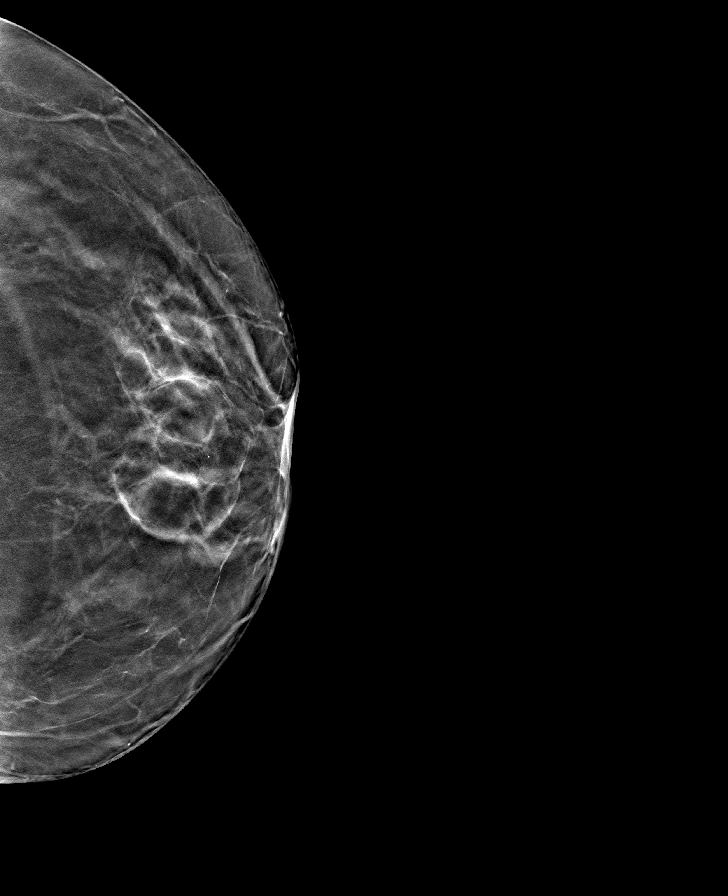

[R MLO tomo · tomo slice 41/82.0]
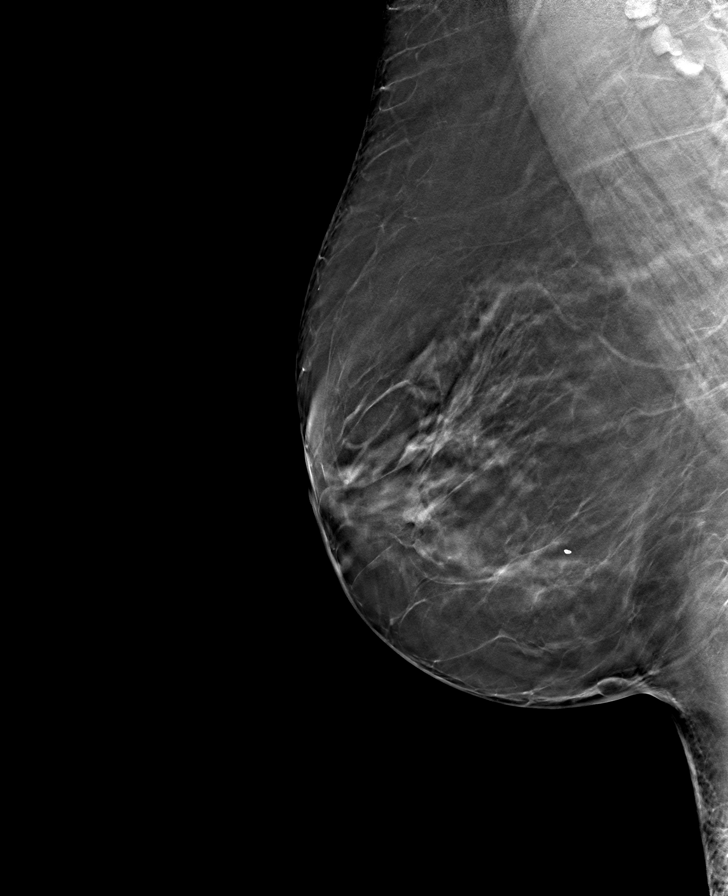

[8 of 24 positions shown; findings below may reference images not displayed]

FINDINGS: Scattered glandular densities are seen. Findings are thought to most 
likely all be postoperative in nature. Attempts will be made to obtain prior 
films for direct comparison. 
Bilateral breast sonography shows a simple cyst within the right breast [DATE] 
position. No suspicious abnormality seen on the left.
IMPRESSION: (BI-RADS 0) Incomplete. Further evaluation will be performed as discussed above, 
and the results will be reported separately.

## 2018-09-15 NOTE — Telephone Encounter (Signed)
Medical Records faxed via Epic 09/15/18 @ 0947 to [facility]

## 2019-05-04 IMAGING — DX HIP BILATERAL WITH PELVIS
1 series · 5 of 5 positions shown · non-contrast
Comparison: None

HIP BILATERAL WITH PELVIS, 05/04/2019 [DATE]: 
CLINICAL INDICATION: Bilateral hip pain left greater than right

[Series 1: AP · U · 0.14mm/px · 5 of 5 slices shown]
[im 1/5]
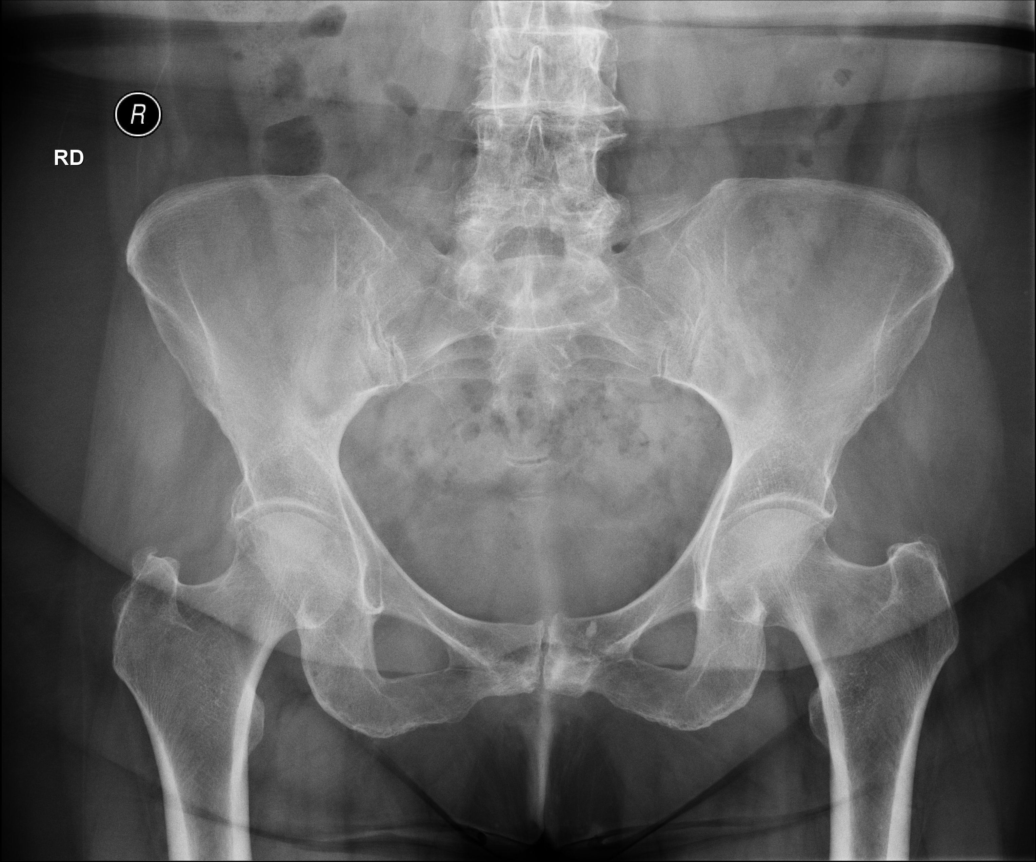
[im 2/5]
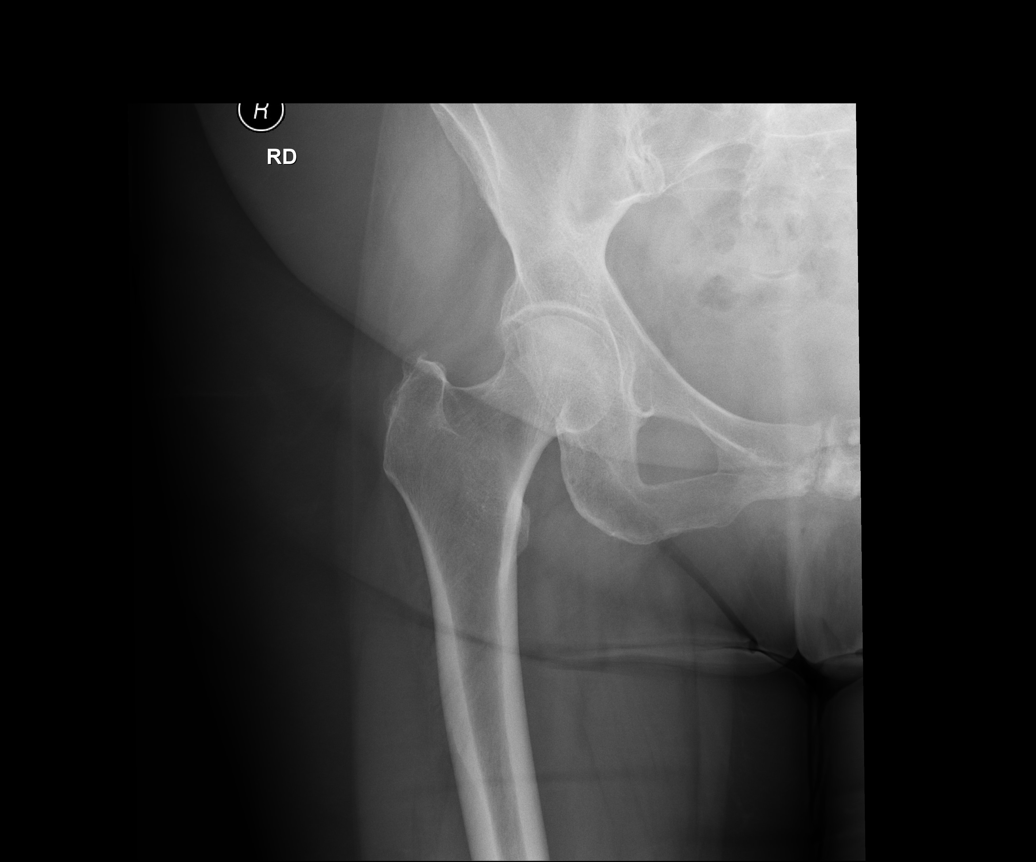
[im 3/5]
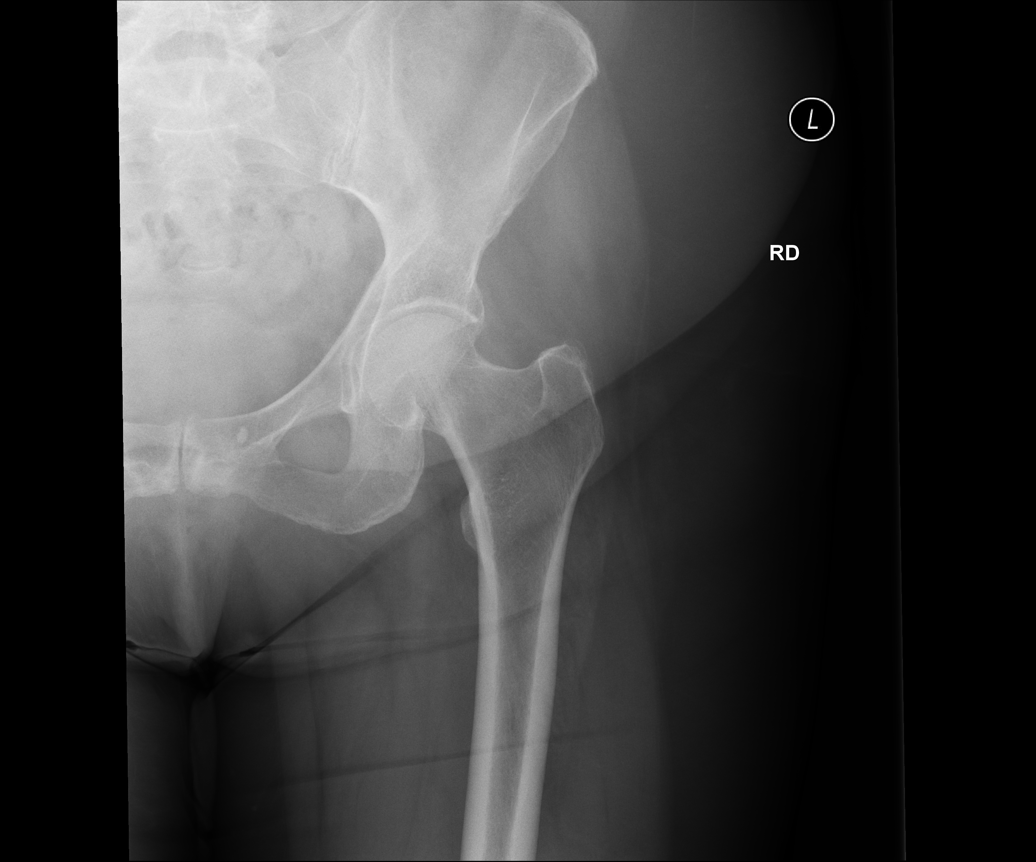
[im 4/5]
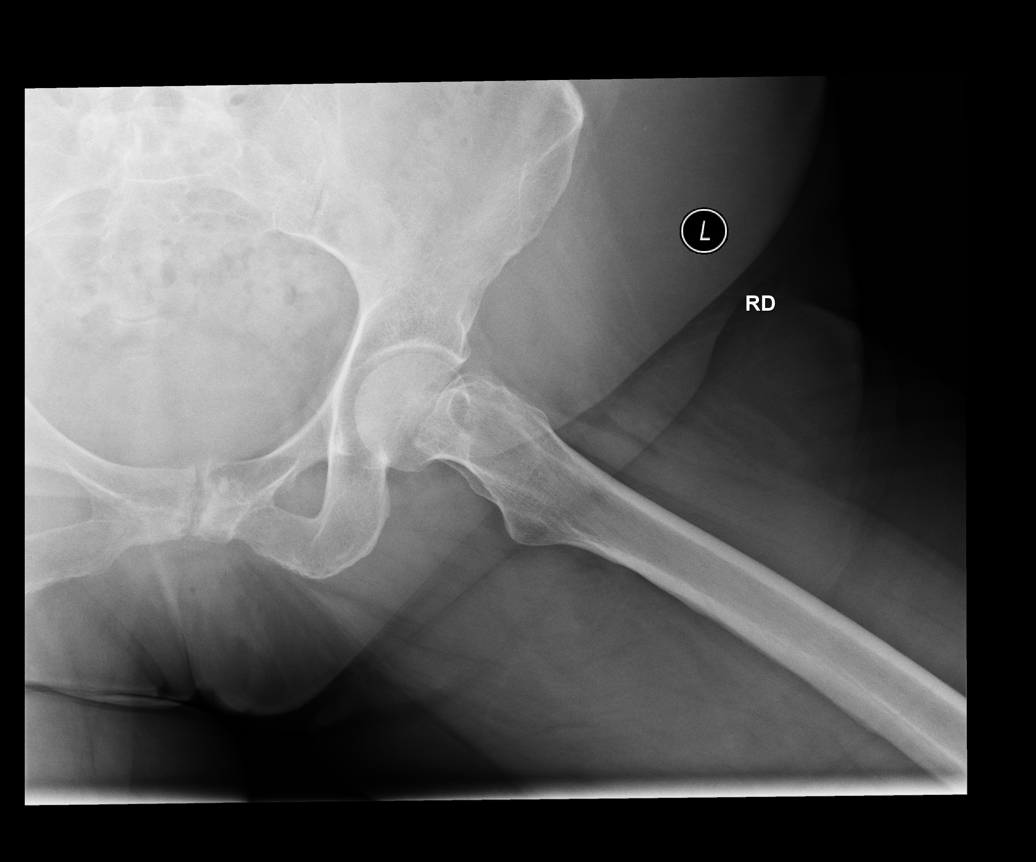
[im 5/5]
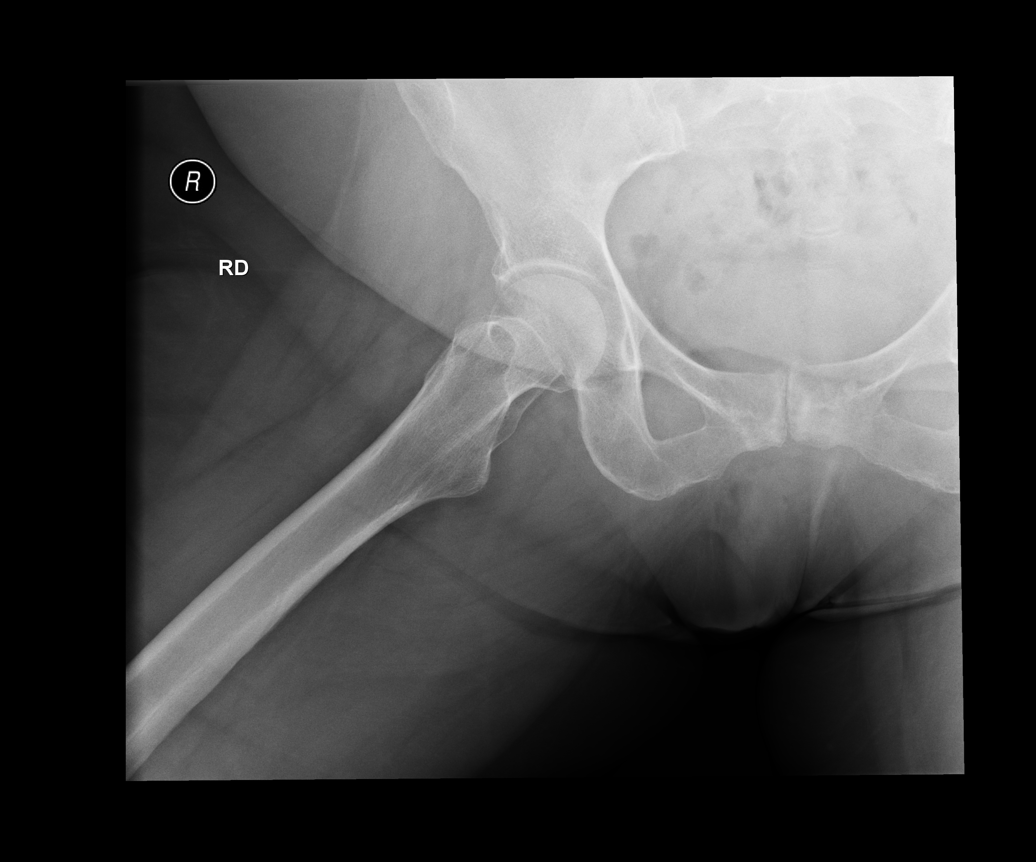

[5 of 5 positions shown; findings below may reference images not displayed]

FINDINGS: Degenerative changes are seen primarily within the pubic symphysis. The hip 
joints appear well-maintained. No subchondral sclerosis or joint space 
narrowing. SI joints appear to be intact. Degenerative changes lower lumbar 
spine.
IMPRESSION: Degenerative changes primarily seen in the lower lumbar spine and pubic 
symphysis. Hip joints appear well-maintained.

## 2019-05-24 IMAGING — CT CT CHEST WITH CONTRAST
2 of 3 series · 14 of 36 positions shown, 17 images · IV contrast (ISOVUE 300)
Comparison: There are no previous exams available for comparison.

CT CHEST WITH CONTRAST, 05/24/2019 [DATE]: 
CLINICAL INDICATION: Massive right lung for follow-up. Mass reportedly found in 
5583. 
A search for DICOM formatted images was conducted for prior CT imaging studies 
completed at a non-affiliated media free facility.
TECHNIQUE: The chest was scanned from base of neck through the lung bases with 
999cc of Isovue 300 injected intravenously on a high resolution low dose CT 
scanner.  Routine MPR and MIP 3D renderings were reconstructed on an independent 
workstation with concurrent physician supervision.

[Series 4: chest 2.0 i31s 3 · axial · 0.96mm/px · z∈[-14,+276]mm · 11 of 171 slices shown, 14 images]
[im 13/171  mediastinal]
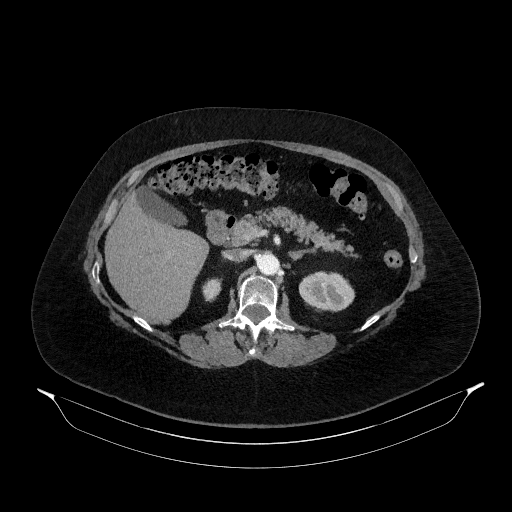
[im 13/171  lung]
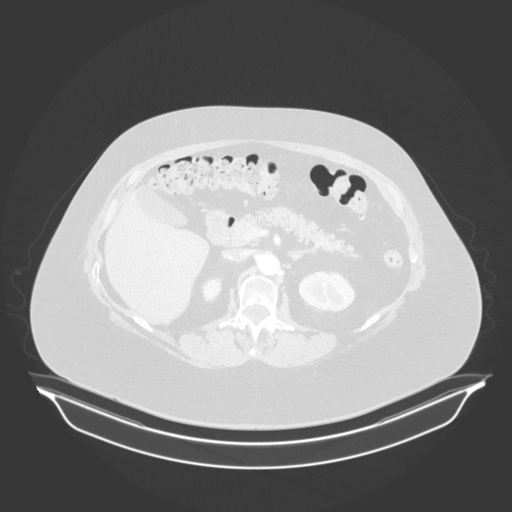
[im 26/171  lung]
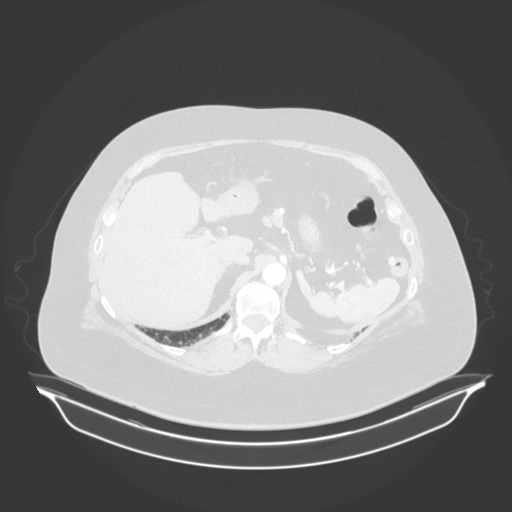
[im 38/171  lung]
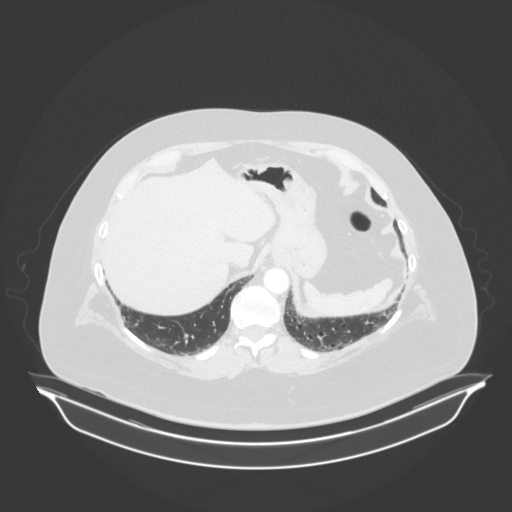
[im 57/171  lung]
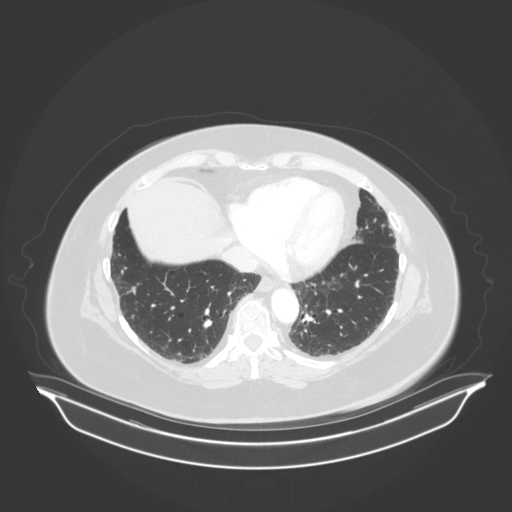
[im 70/171  mediastinal]
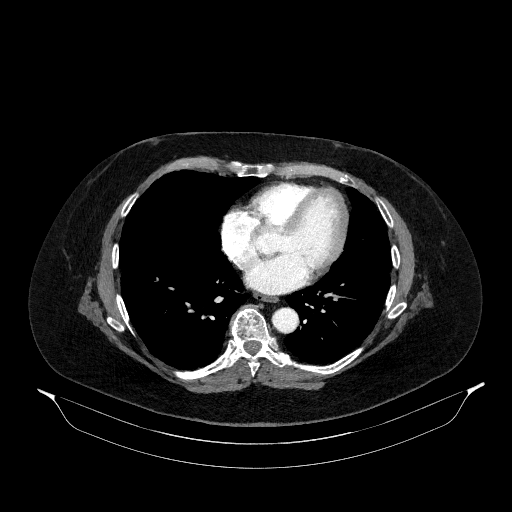
[im 70/171  lung]
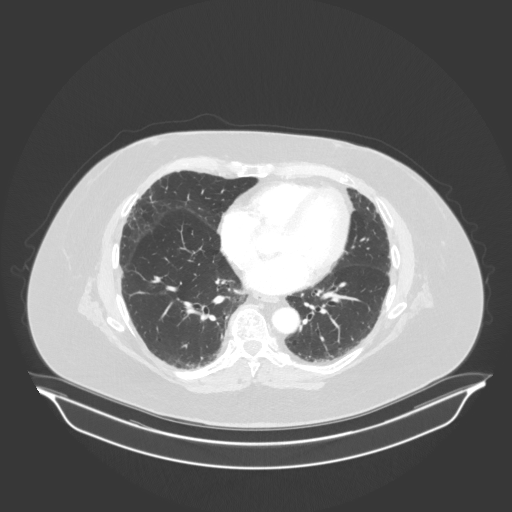
[im 89/171  lung]
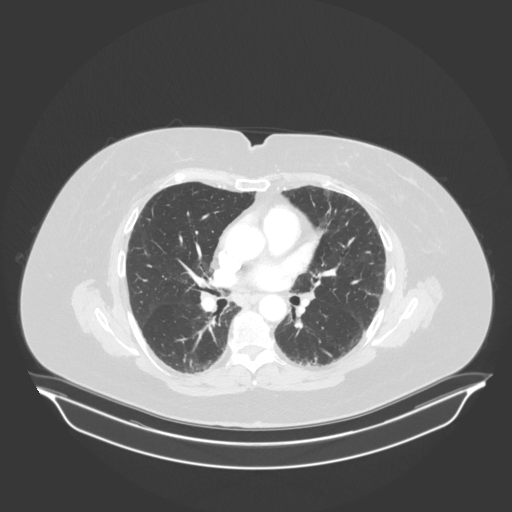
[im 101/171  lung]
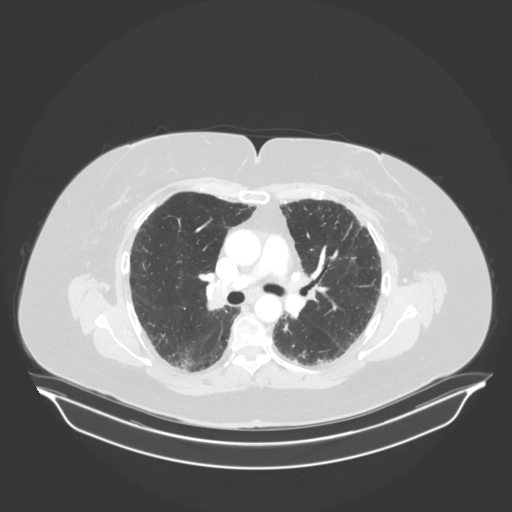
[im 114/171  lung]
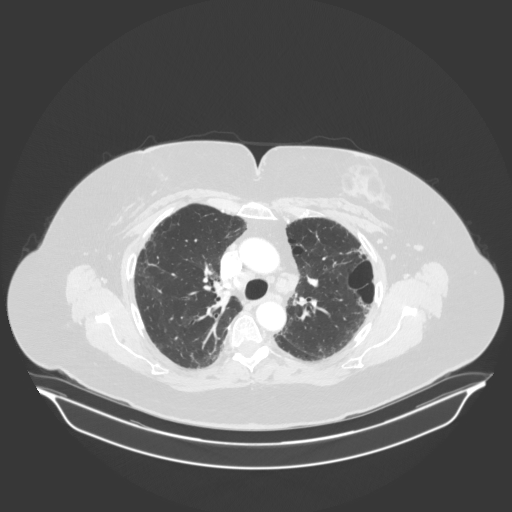
[im 133/171  mediastinal]
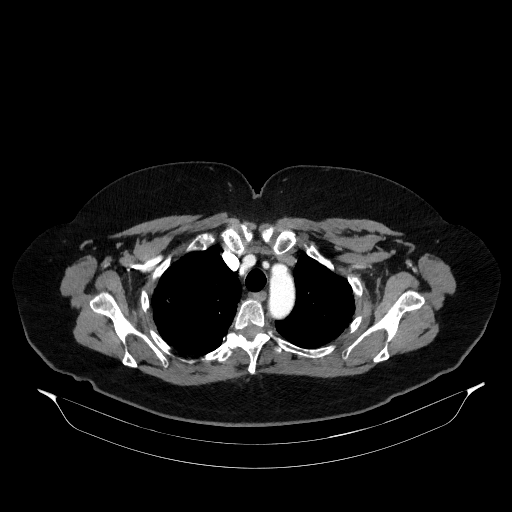
[im 133/171  lung]
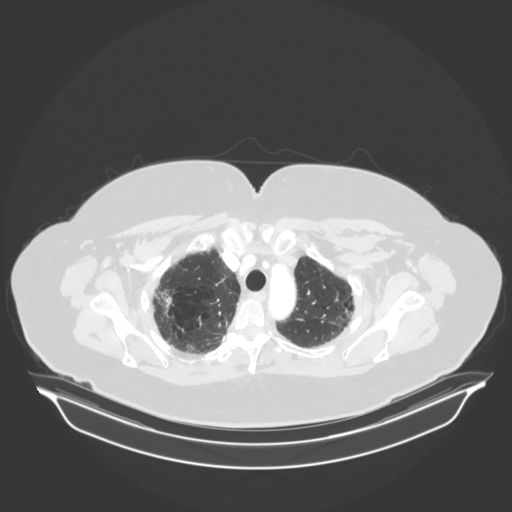
[im 145/171  lung]
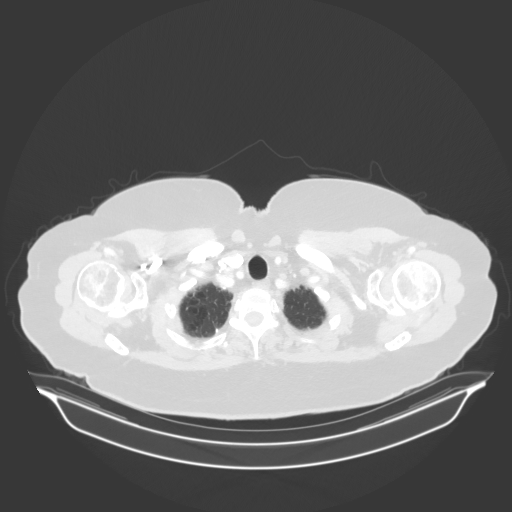
[im 158/171  lung]
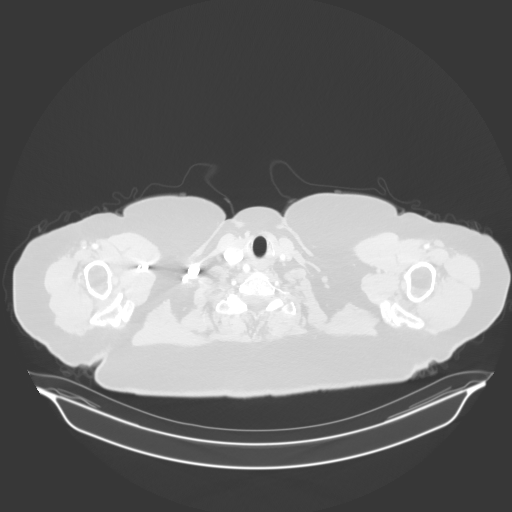

[Series 6: coronal · coronal · 0.67mm/px · 3 of 124 slices shown]
[im 25/124  lung]
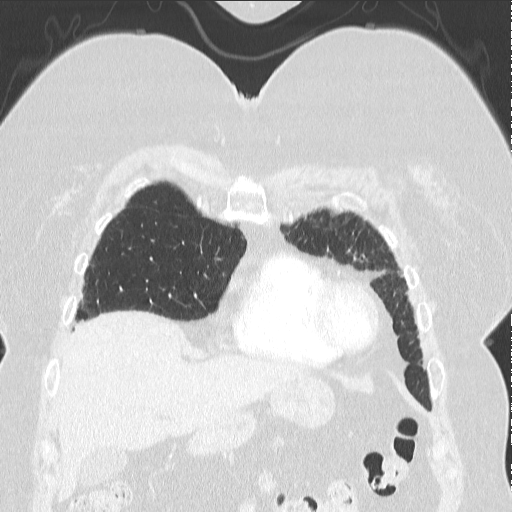
[im 50/124  lung]
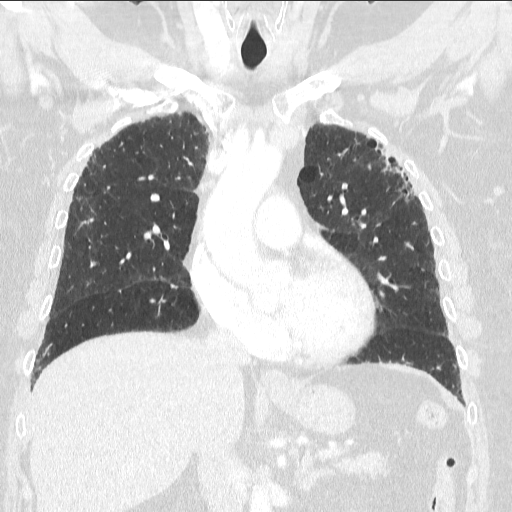
[im 74/124  lung]
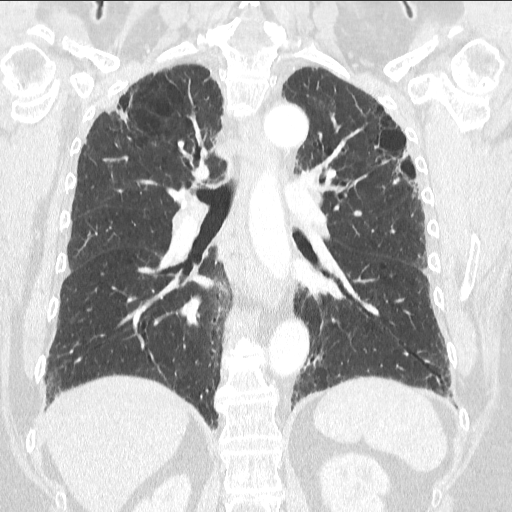

[14 of 36 positions shown; findings below may reference images not displayed]

FINDINGS: There is a nodular density in the right upper lobe measuring 9 mm x 5 
mm. There is also a calcified granuloma in the right upper lobe. No mass or 
consolidations are seen in the lungs. There are some moderate emphysematous 
changes lungs as well as some scarring in the periphery of the lungs without 
consolidations. Borderline mediastinal lymph nodes measuring up to 1.2 cm.  No 
axillary and no hilar adenopathy. Heart size normal. No central pulmonary 
embolus is seen. 
Limited CT of the upper abdomen demonstrates normal visualized portion of the 
liver and spleen. The adrenals are normal. No lytic or blastic lesions are seen 
in the chest.
IMPRESSION: 9 mm x 5 mm nodular density in the right upper lobe, unfortunately previous CT 
is not available for comparison to assess interval change. May consider 
follow-up with PET/CT for further evaluation or followed with CT of chest in 3-6 
months as per reference below. If previous CT can be located then would also be 
helpful for comparison. 
Outside granuloma right upper lobe. 
Mild emphysematous changes lungs with some mild scarring in the periphery of 
lungs. 
Borderline size mediastinal lymph node measuring up to 1.2 cm. 
REFERENCE: 
Recommendations for pulmonary nodule follow-up according to [HOSPITAL] 
Guidelines. 
Multiple nodules size: < 6mm 
*Low risk patients: no routine follow-up. 
*High risk patients: optional CT at 12 months. 
Multiple nodules size: 6-8mm 
*Low risk patients: follow-up at 3-6 months, then consider further follow-up at 
18-24 months. 
*High risk patients: follow-up at 3-6 months, then at 18-24 months if no change. 
Multiple nodules size: > 8mm 
*Low risk patients: follow-up at 3-6 months, then consider further follow-up at 
18-24 months. 
*High risk patients: follow-up at 3-6 months, then at 18-24 months if no change. 
NOTE: 
Low risk patients: minimal or absent history of smoking and or other known risk 
factors. 
High risk patients: history of smoking or of other known risk factors (e.g. 
first degree relative with lung cancer, or exposure to asbestos, radon uranium) 
If a nodule up to 8mm is partly solid or is ground glass further follow up is 
required after 24 months to exclude possible slow growing adenocarcinoma ([place]) 
Size is average of length and width. 
RADIATION DOSE REDUCTION: All CT scans are performed using radiation dose 
reduction techniques, when applicable.  Technical factors are evaluated and 
adjusted to ensure appropriate moderation of exposure.  Automated dose 
management technology is applied to adjust the radiation doses to minimize 
exposure while achieving diagnostic quality images.

## 2019-12-07 IMAGING — MG MAMMOGRAPHY SCREENING BILATERAL 3D TOMOSYNTHESIS WITH CAD
8 series · 8 of 24 positions shown · non-contrast
Comparison: 06/28/2016 
BREAST DENSITY: (Level C) The breasts are heterogeneously dense, which may 
obscure small masses.

MAMMOGRAPHY SCREENING BILATERAL 3D TOMOSYNTHESIS WITH CAD, 12/07/2019 [DATE]: 
CLINICAL INDICATION: 74-year-old female for bilateral screening mammogram. 
Previous Bilateral breast reduction in right-sided biopsy.
TECHNIQUE: Digital bilateral mammograms and 3-D Tomosynthesis were obtained. 
These were interpreted both primarily and with the aid of computer-aided 
detection system.

[L MLO]
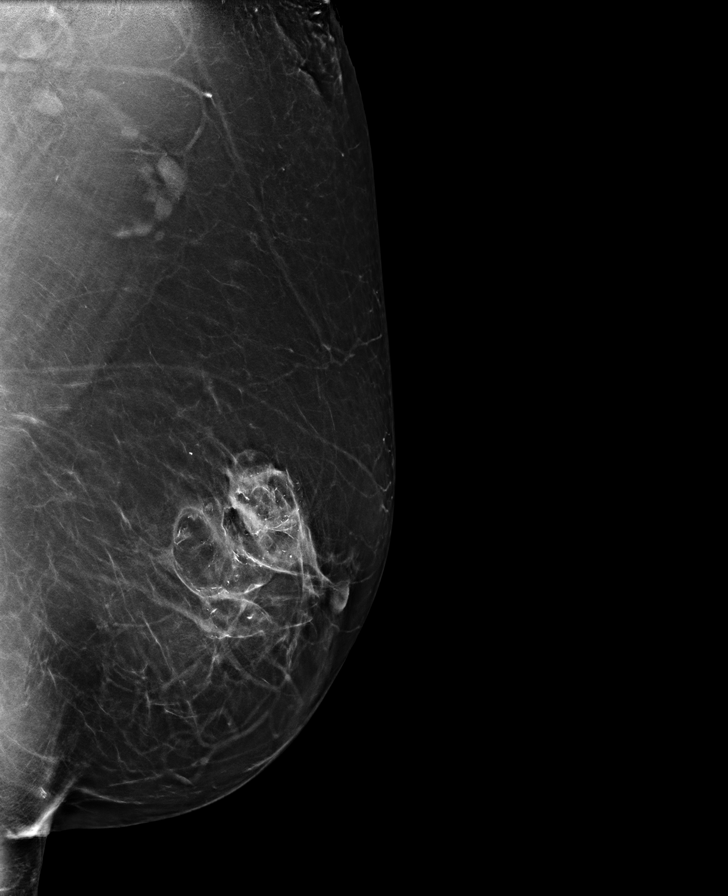

[L CC]
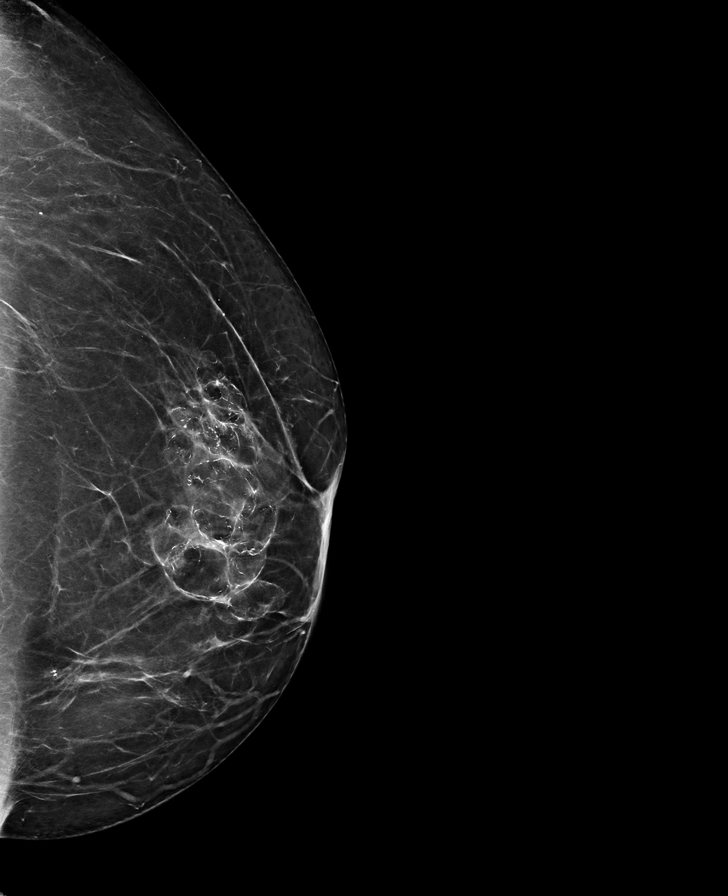

[R MLO]
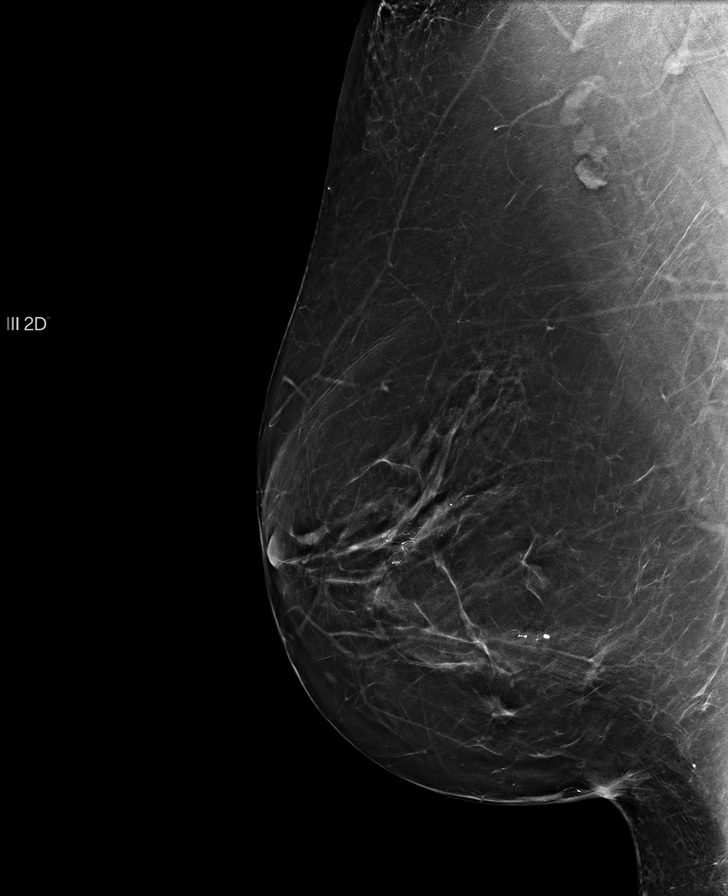

[R CC]
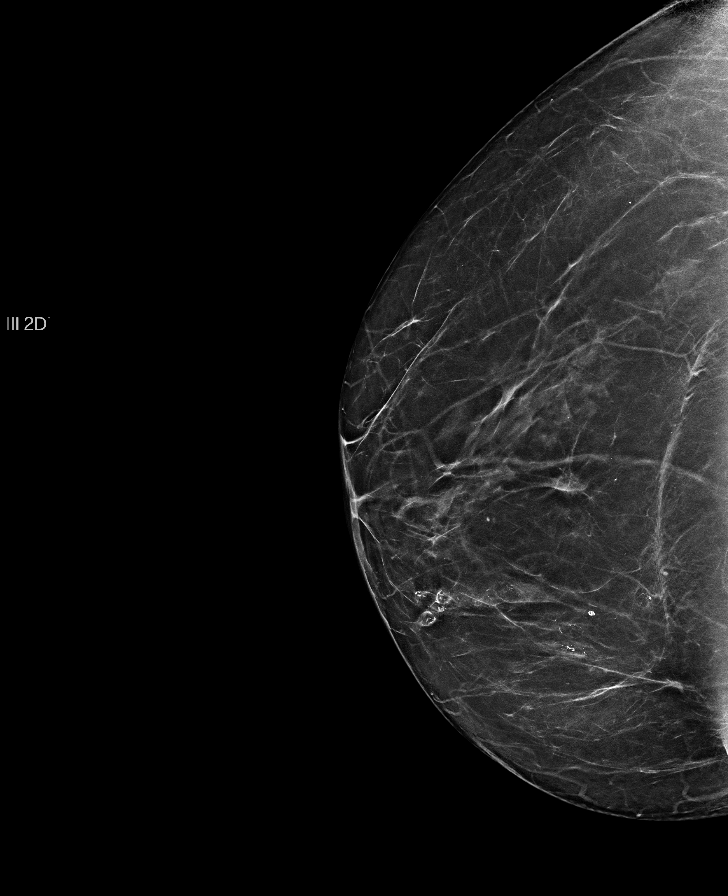

[L CC tomo · tomo slice 43/85.0]
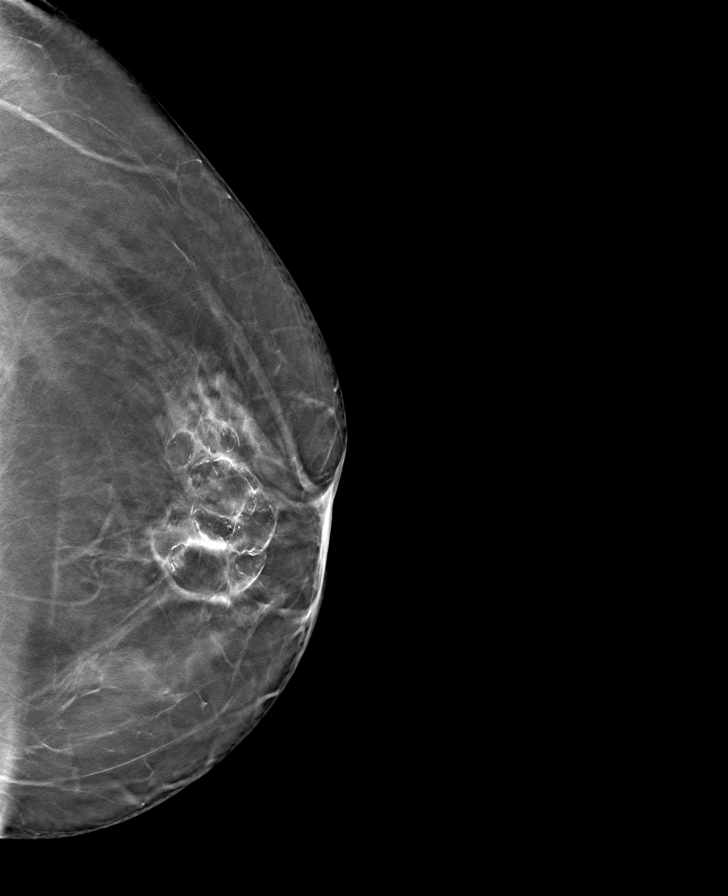

[R CC tomo · tomo slice 41/81.0]
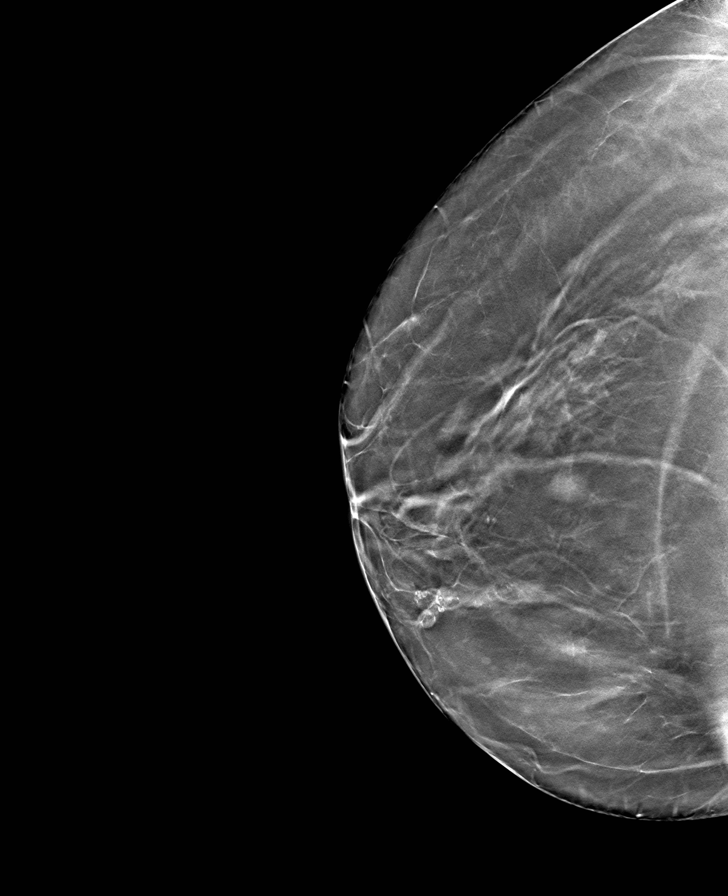

[L MLO tomo · tomo slice 51/100.0]
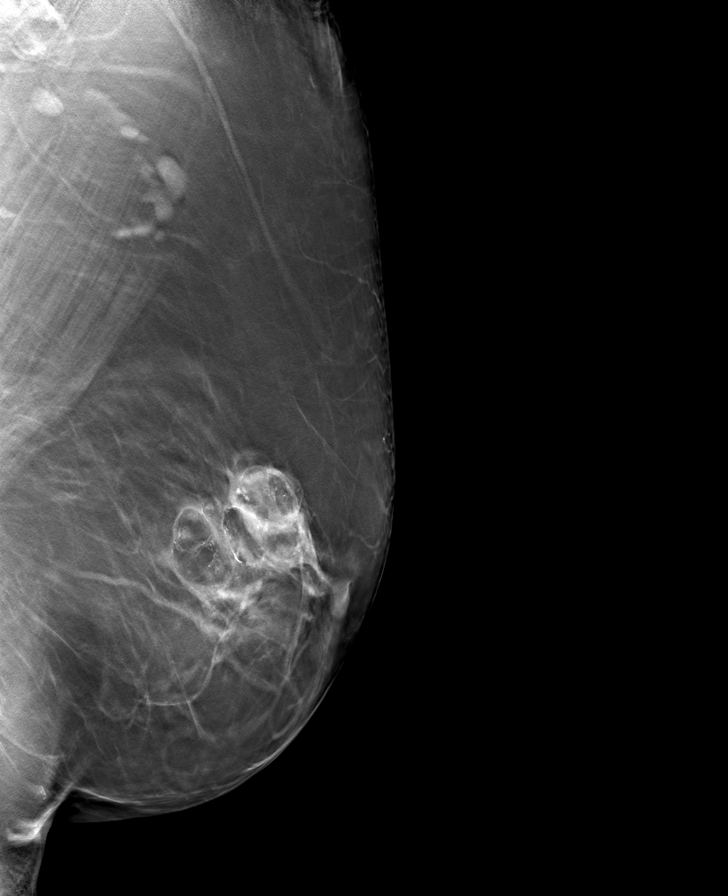

[R MLO tomo · tomo slice 50/99.0]
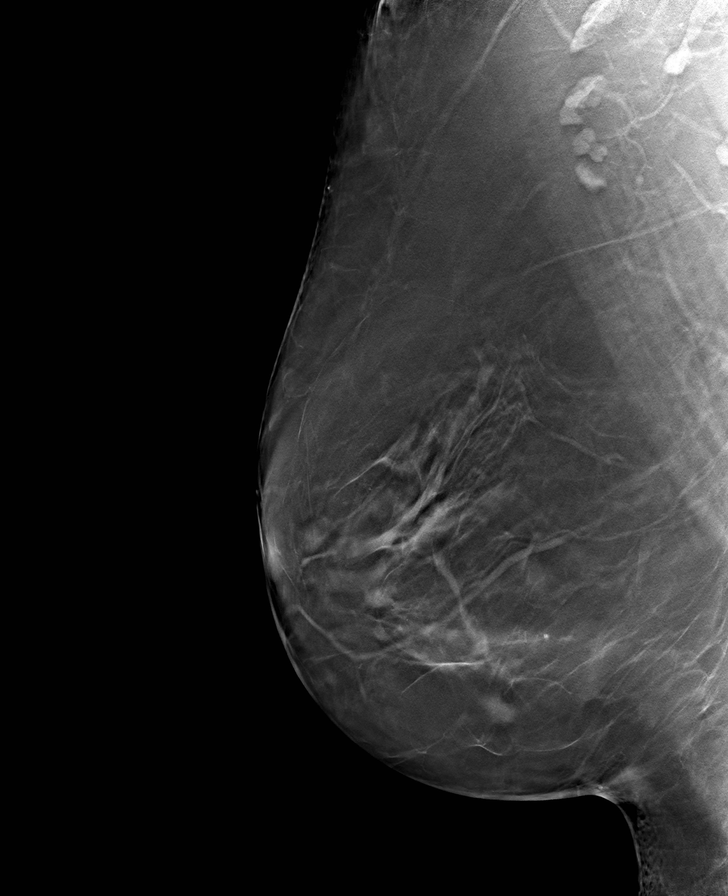

[8 of 24 positions shown; findings below may reference images not displayed]

FINDINGS: There is developing density with possible spiculation at the [DATE] 
position of the right breast. There are also developing calcifications in the 
central left breast. Patient will be brought back for additional views of 
bilateral breast and possible right breast ultrasound if needed.
IMPRESSION: (BI-RADS 0) Incomplete. Further evaluation will be performed as discussed above, 
and the results will be reported separately.

## 2020-01-13 IMAGING — MG MAMMOGRAPHY DIAGNOSTIC RIGHT 3D TOMOSYNT
4 series · 4 of 12 positions shown · non-contrast
Comparison: Comparison was made to prior examinations. 
BREAST DENSITY: (Level B) There are scattered areas of fibroglandular density.

MAMMOGRAPHY DIAGNOSTIC RIGHT 3D TOMOSYNTHESIS WITH CAD, 01/13/2020 [DATE]: 
CLINICAL INDICATION: Status post right breast ultrasound-guided biopsy
TECHNIQUE: Unilateral right breast digital mammography and 3-D Tomosynthesis 
were obtained. In addition, computer-aided detection was utilized.

[R ML]
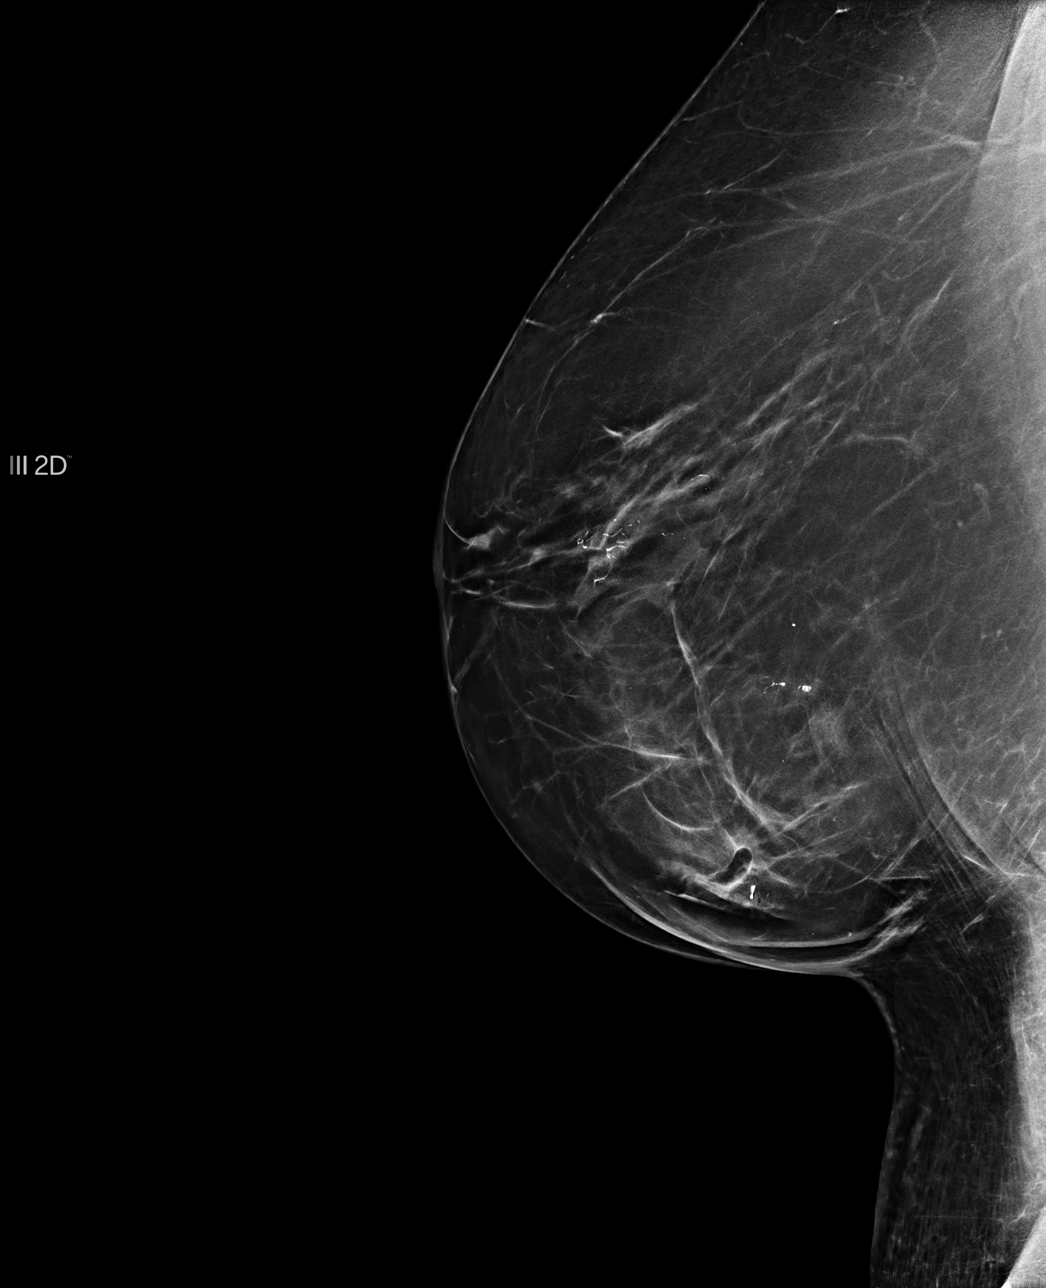

[R CC]
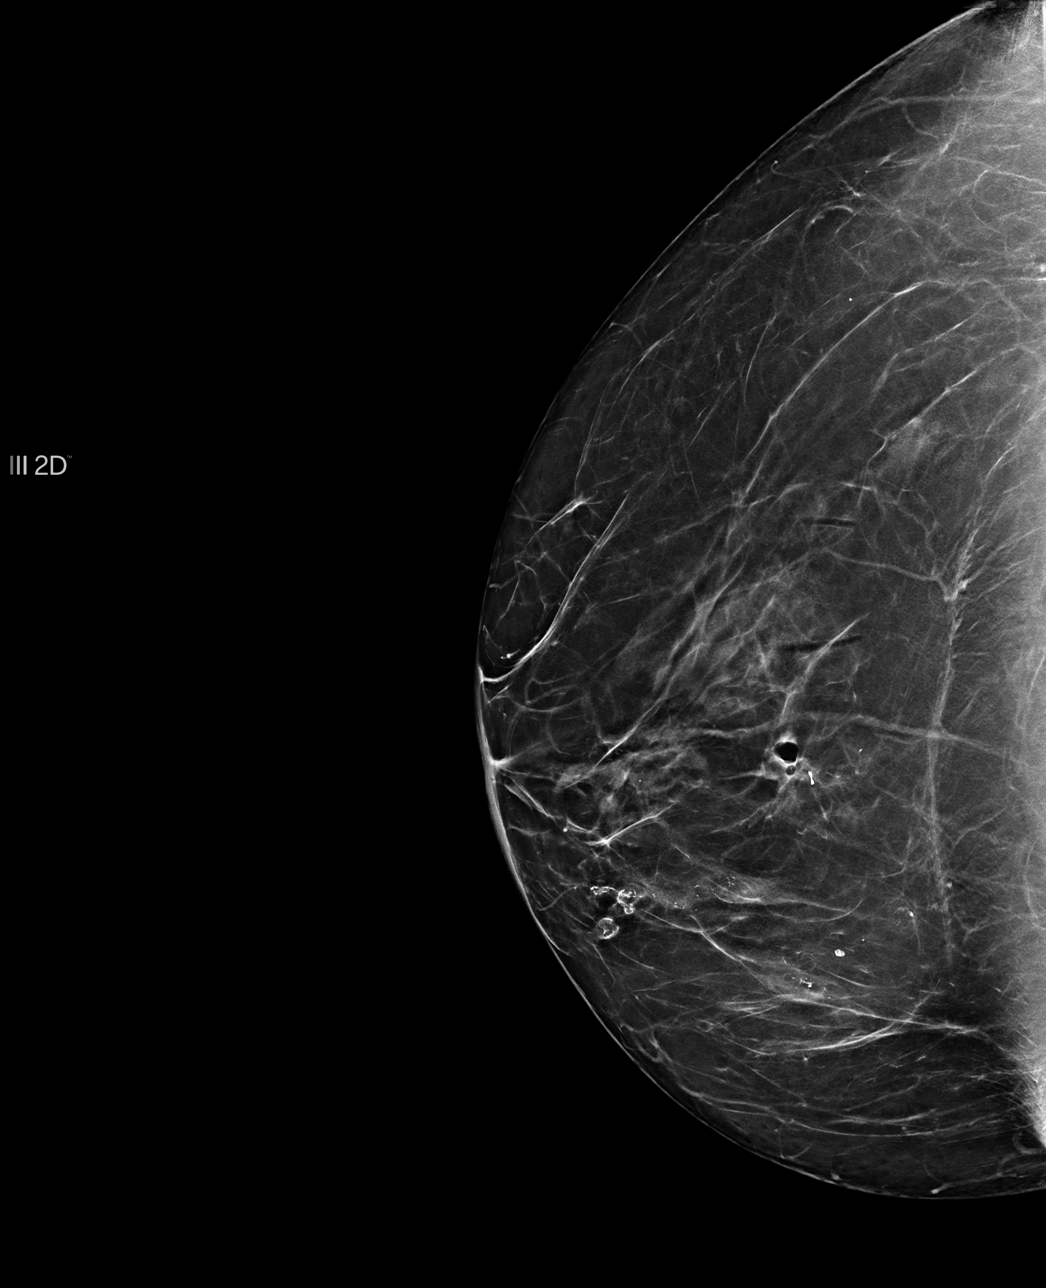

[R CC tomo · tomo slice 43/84.0]
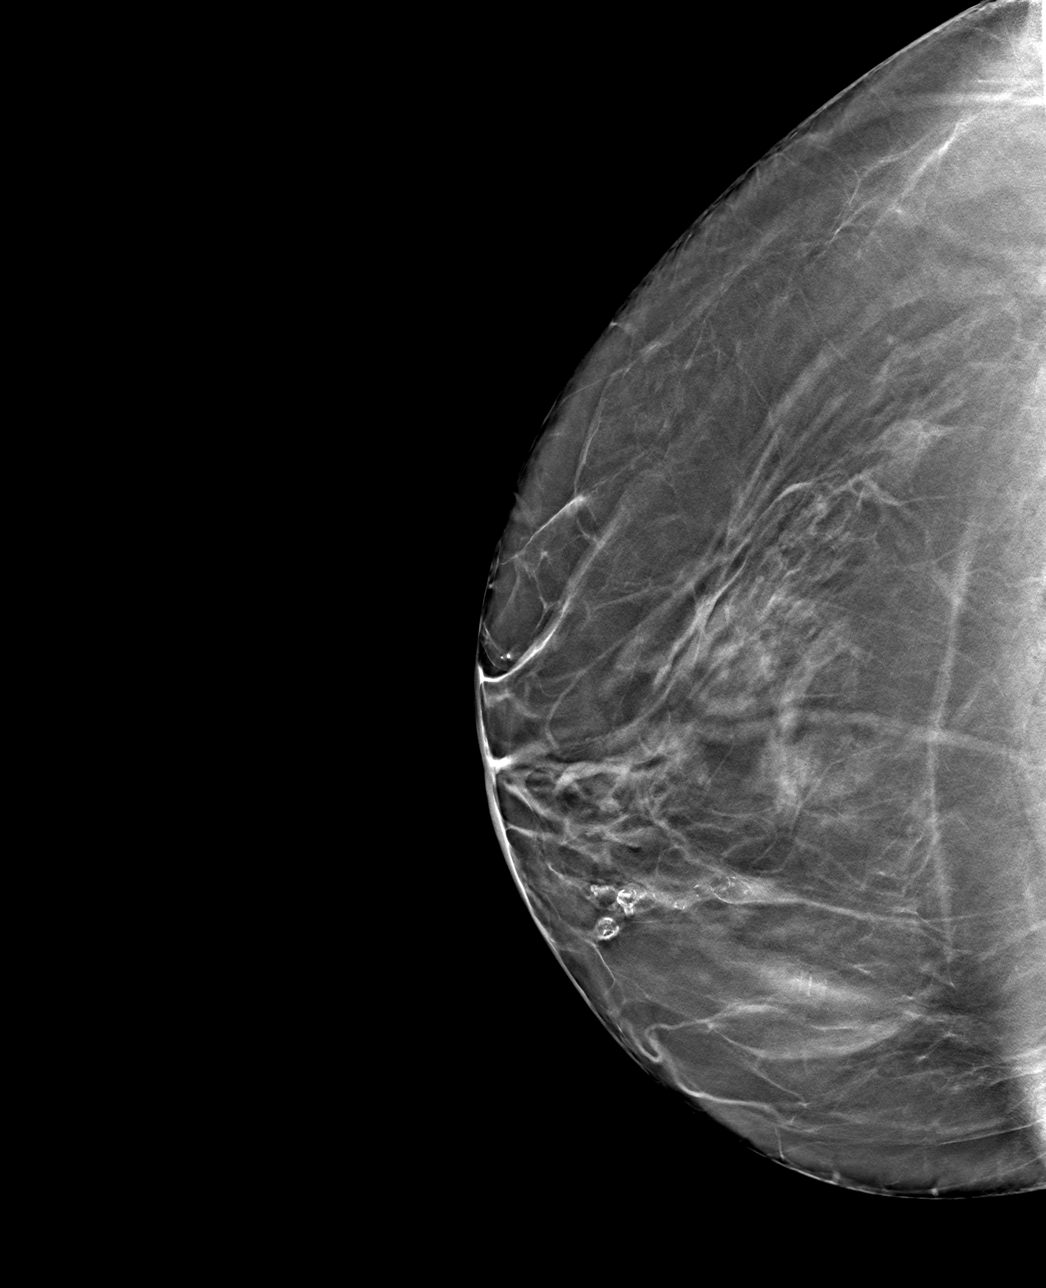

[R ML tomo · tomo slice 49/97.0]
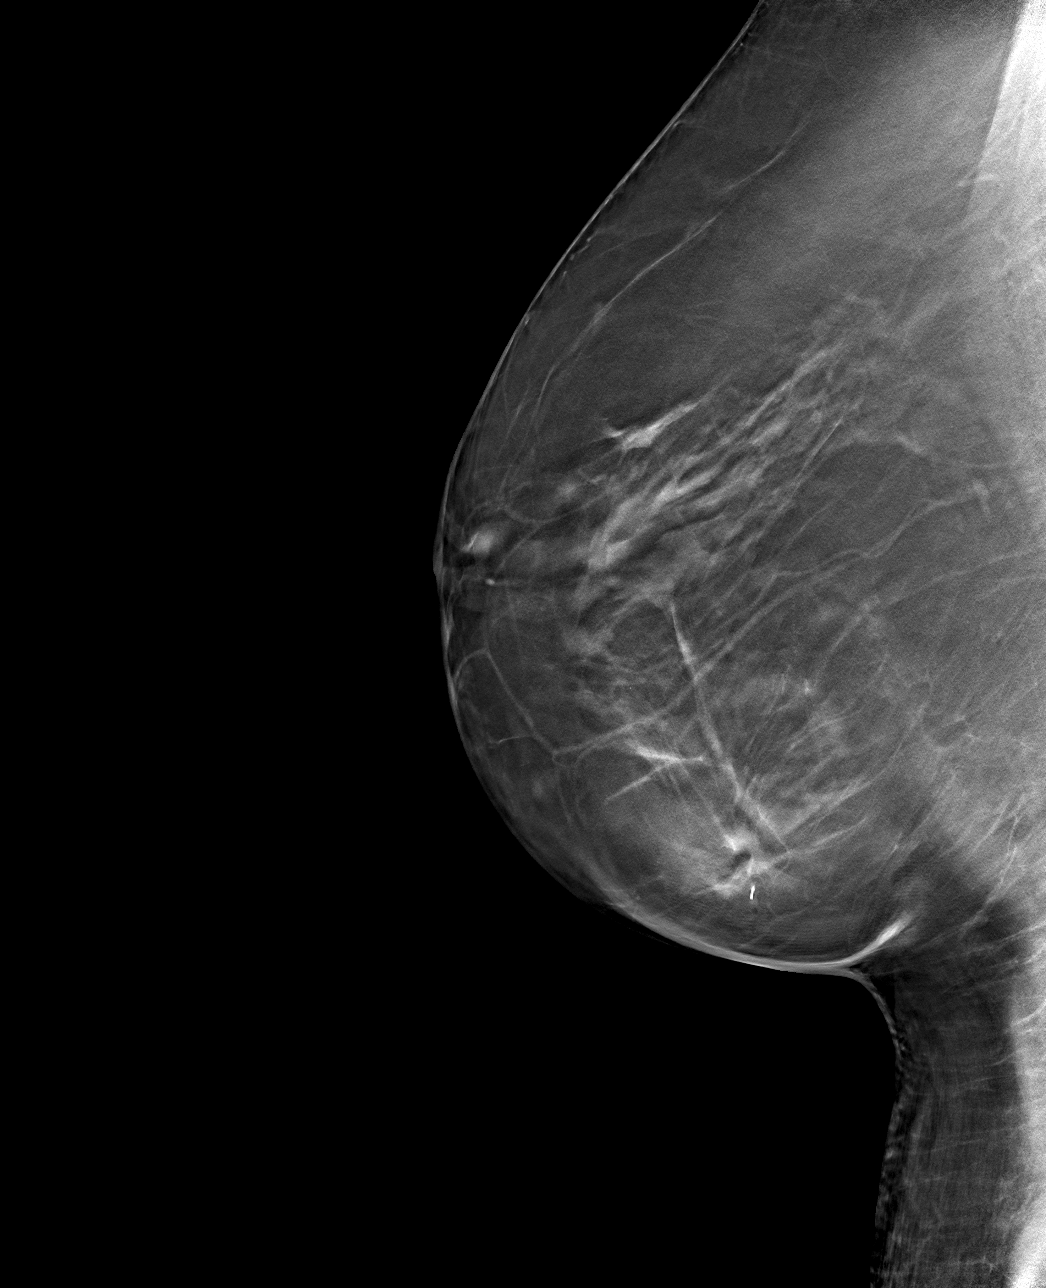

[4 of 12 positions shown; findings below may reference images not displayed]

FINDINGS: Satisfactory placement of a paddle-shaped post procedure clip at the 
site of today's ultrasound-guided right breast biopsy. There is no significant 
clip migration or hematoma formation.
IMPRESSION: Satisfactory placement of a paddle-shaped post procedure clip at the site of 
today's ultrasound-guided right breast biopsy. No clip migration.

## 2020-05-16 IMAGING — DX KNEE 3 VIEWS RIGHT
1 series · 3 of 3 positions shown · non-contrast
Comparison: None

KNEE 3 VIEWS RIGHT, 05/16/2020 [DATE]: 
CLINICAL INDICATION:  Right anterolateral knee pain x1 year

[Series 1: ap int rot · U · 0.14mm/px · 3 of 3 slices shown]
[im 1/3]
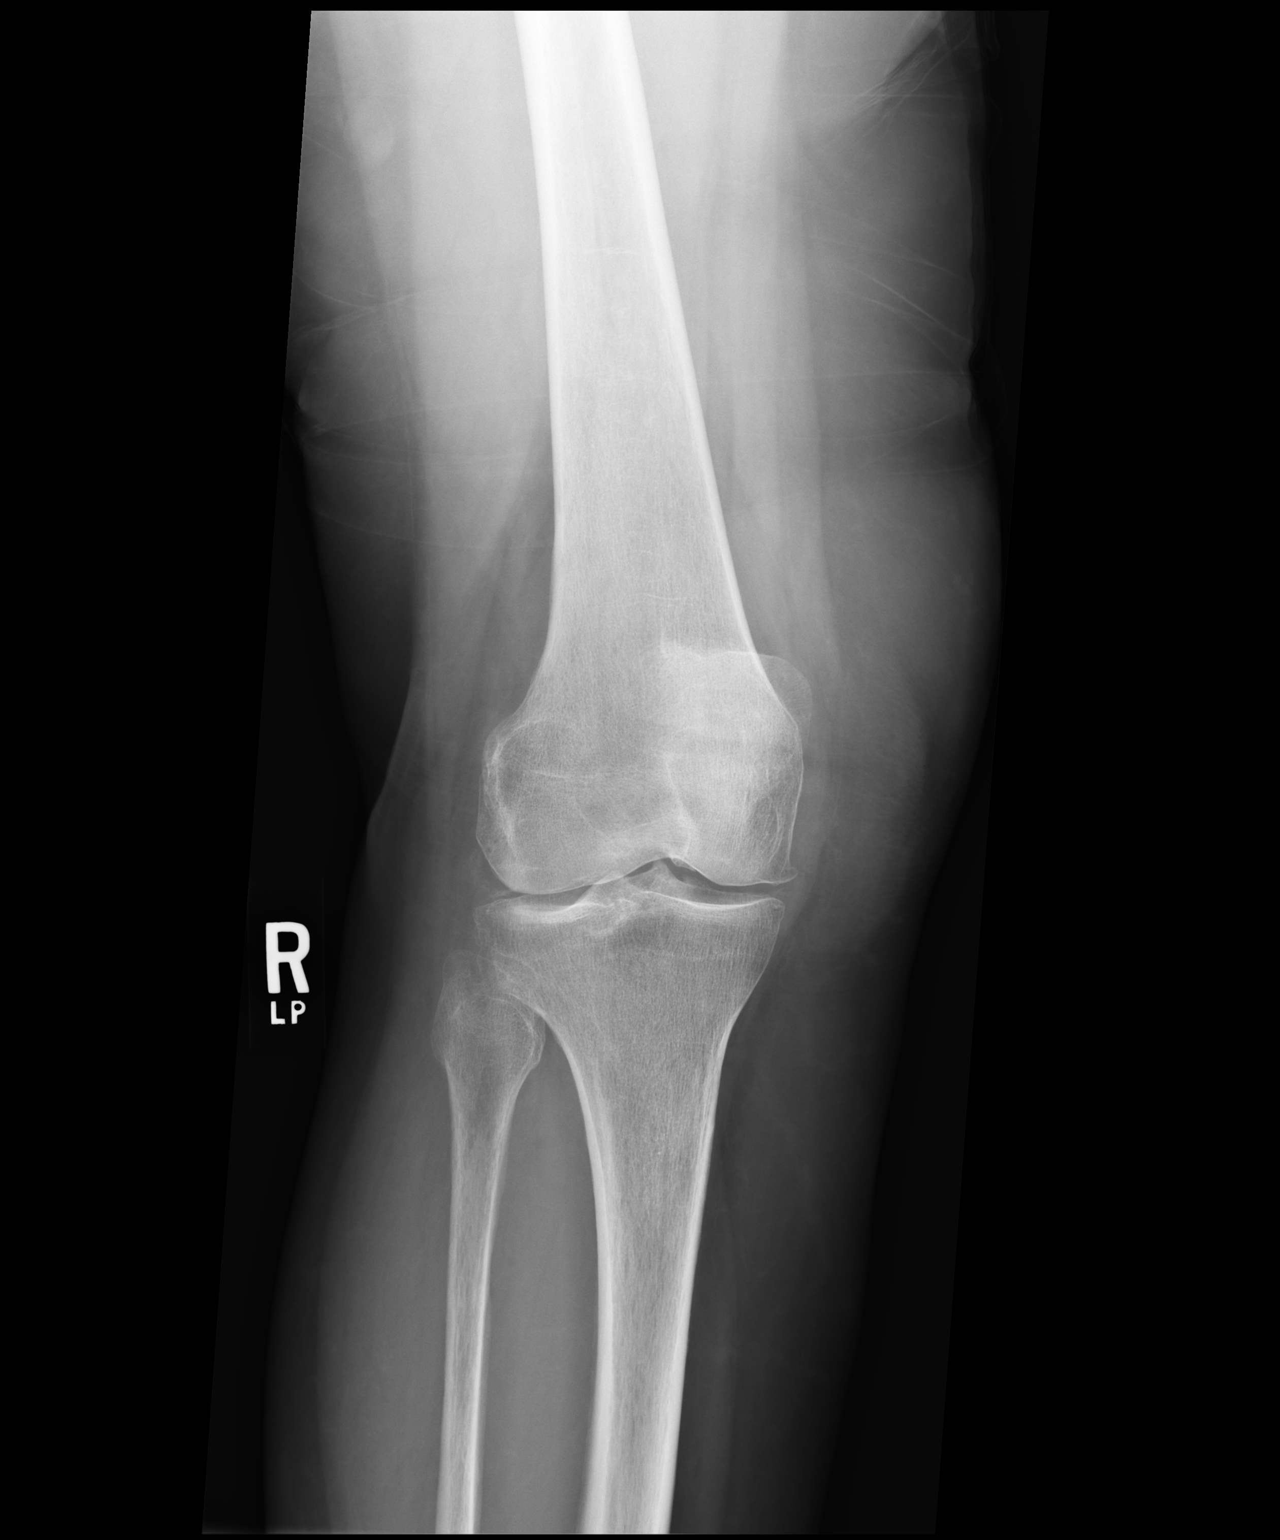
[im 2/3]
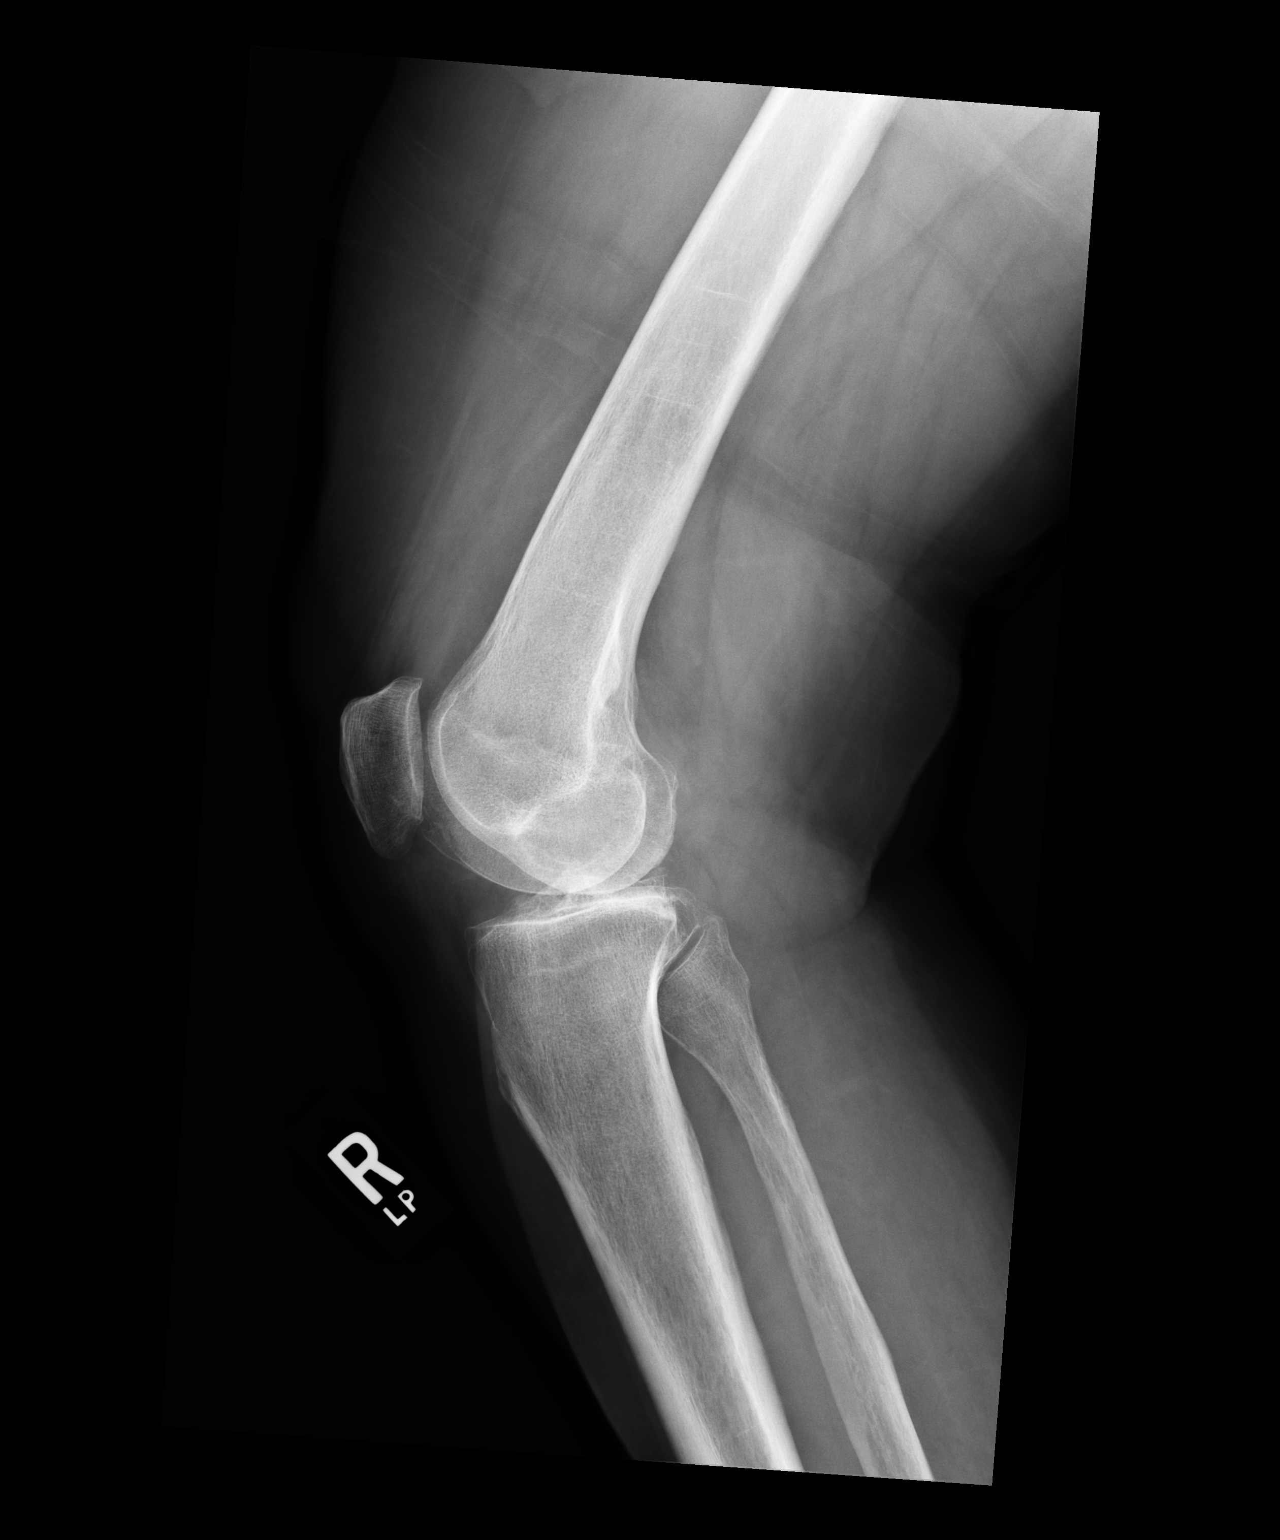
[im 3/3]
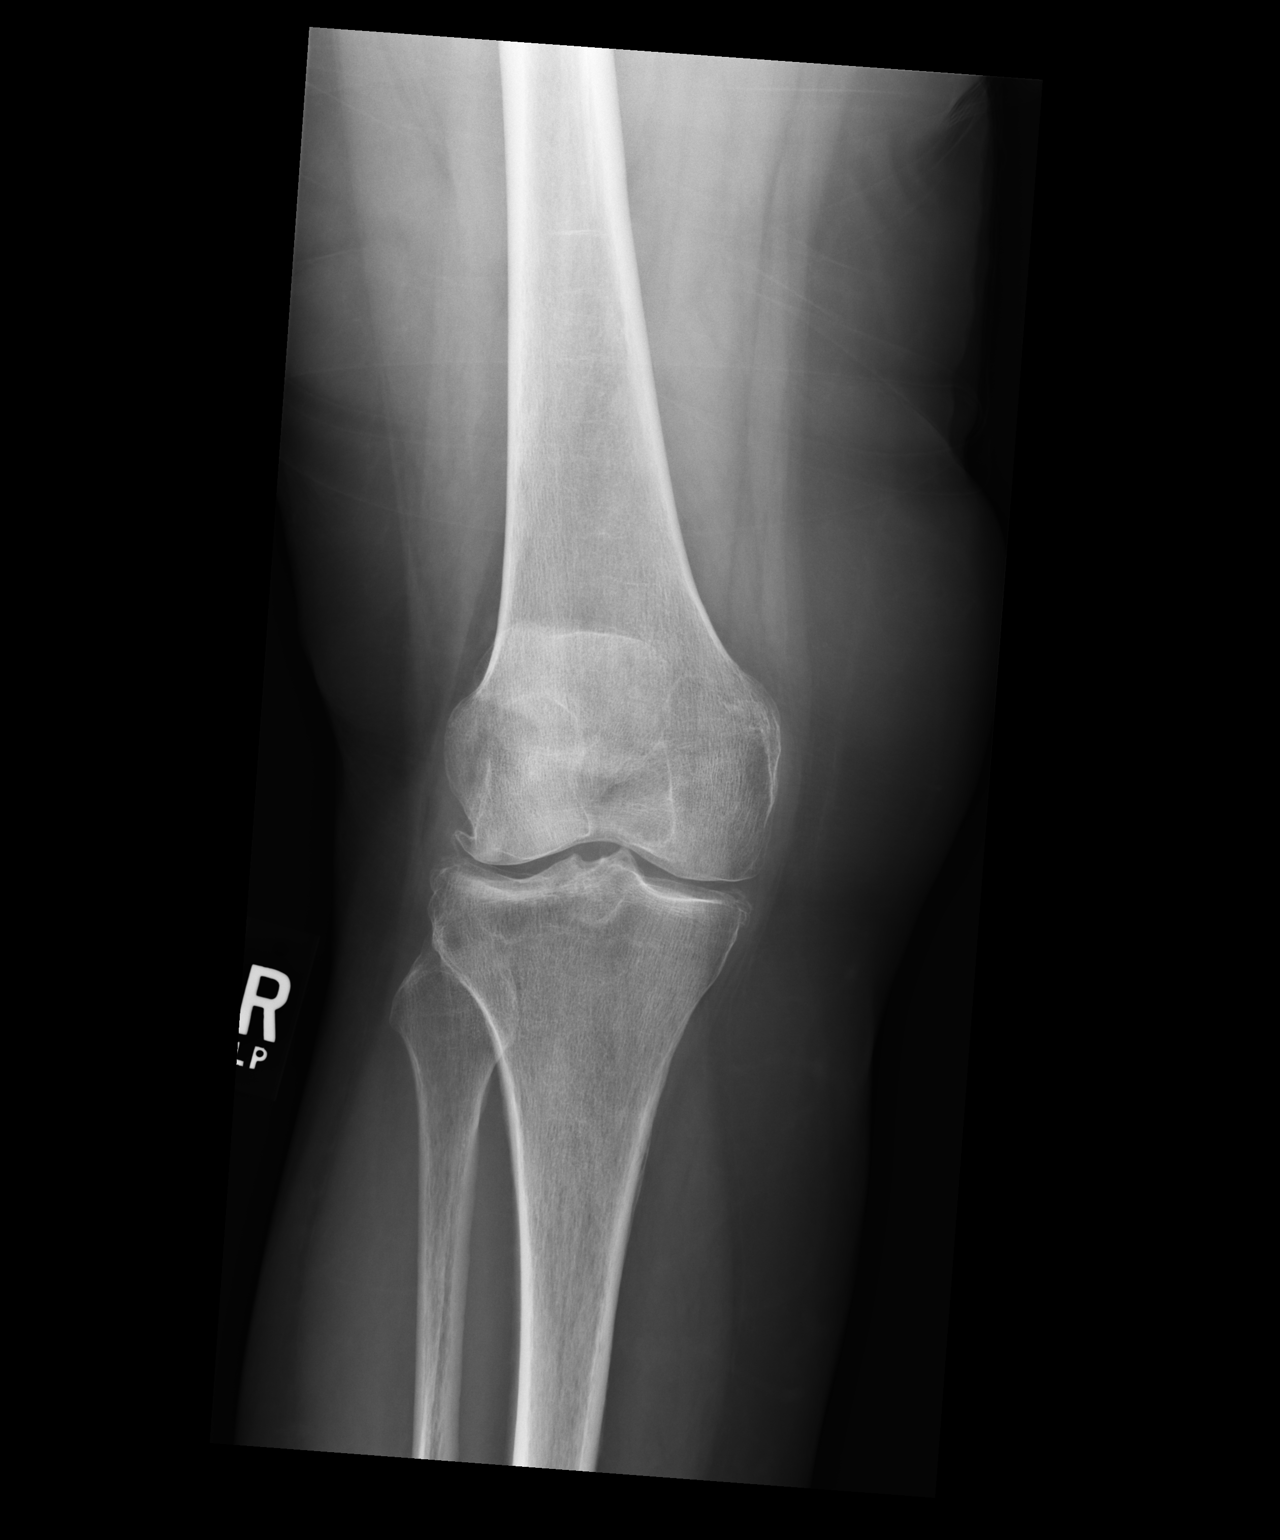

[3 of 3 positions shown; findings below may reference images not displayed]

FINDINGS: There is mild to moderate osteoarthritic change. There is moderate 
narrowing of the lateral tibial femoral compartment with marginal osteophyte. 
There is patellofemoral joint space narrowing with mild patellar osteophytes. 
There is no significant joint effusion. No fracture or dislocation. No bone 
erosion or chondrocalcinosis.
IMPRESSION: Mild to moderate osteoarthritic changes. MRI would be useful if indicated..

## 2020-05-19 IMAGING — CT CT CHEST WITH CONTRAST
2 of 4 series · 11 of 36 positions shown, 13 images · IV contrast (Iodine)
Comparison: Comparison was made to the prior exam(s) within the last 12 months 
05/24/2019

CT CHEST WITH CONTRAST, 05/19/2020 [DATE]: 
CLINICAL INDICATION:  Follow-up right upper lobe nodule. 
A search for DICOM formatted images was conducted for prior CT imaging studies 
completed at a non-affiliated media free facility.
TECHNIQUE: The chest was scanned from base of neck through the lung bases with 
100 mL of Isovue 300 injected intravenously on a high resolution low dose CT 
scanner. Routine MPR and MIP 3D renderings were reconstructed on an independent 
workstation with concurrent physician supervision.

[Series 401: chest w, idose (3) · axial · 0.84mm/px · z∈[+982,+1260]mm · 8 of 165 slices shown, 10 images]
[im 13/165  mediastinal]
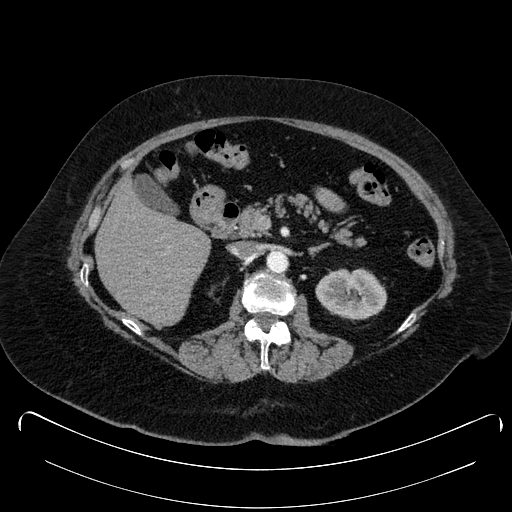
[im 13/165  lung]
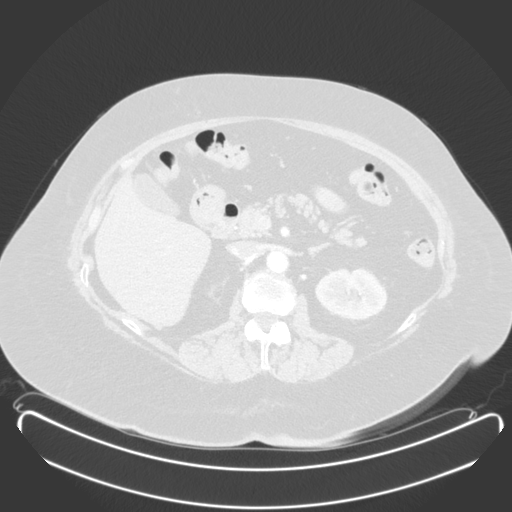
[im 38/165  lung]
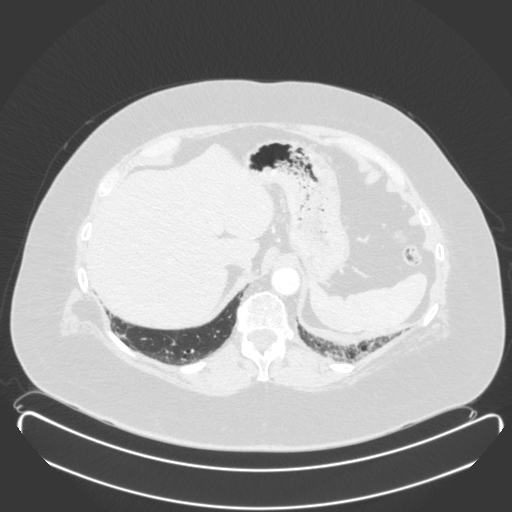
[im 51/165  lung]
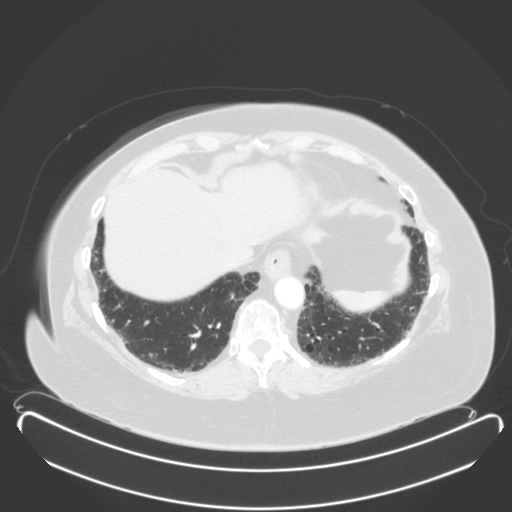
[im 76/165  lung]
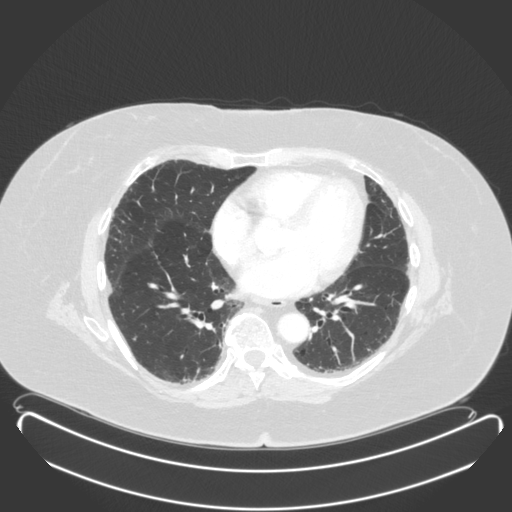
[im 89/165  mediastinal]
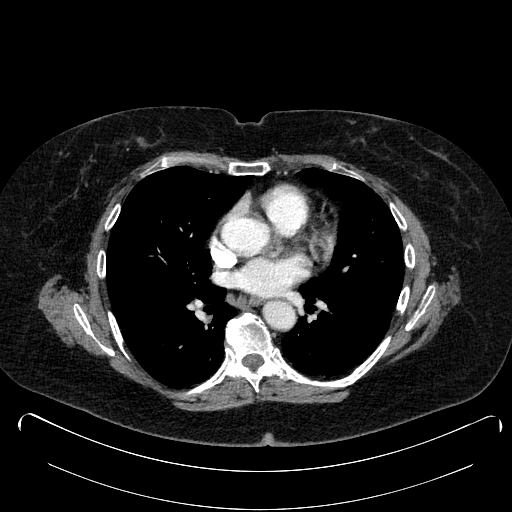
[im 89/165  lung]
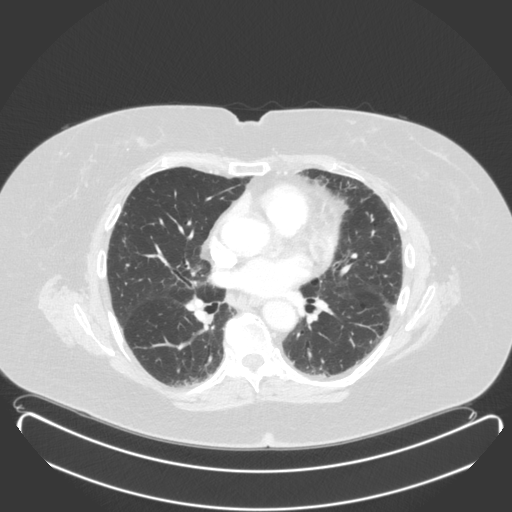
[im 114/165  lung]
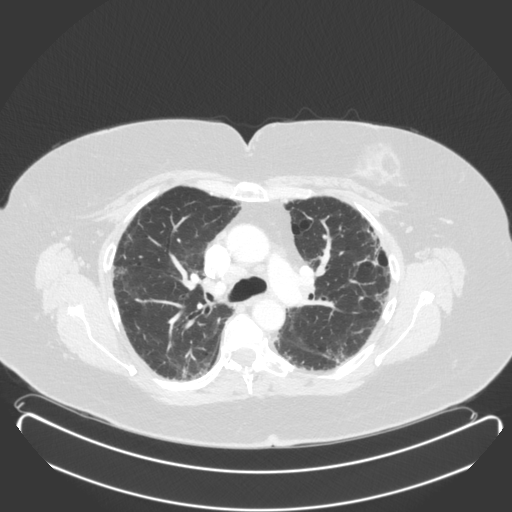
[im 127/165  lung]
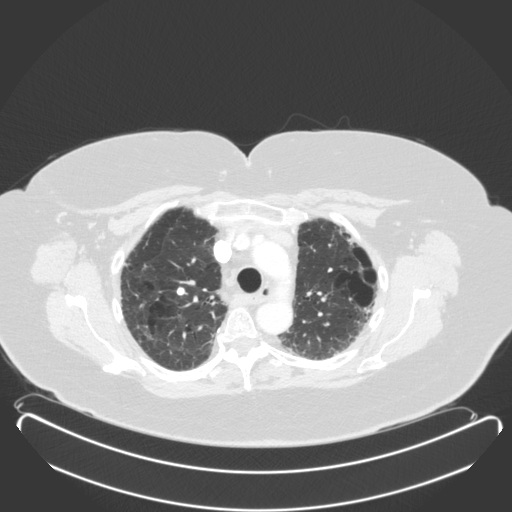
[im 152/165  lung]
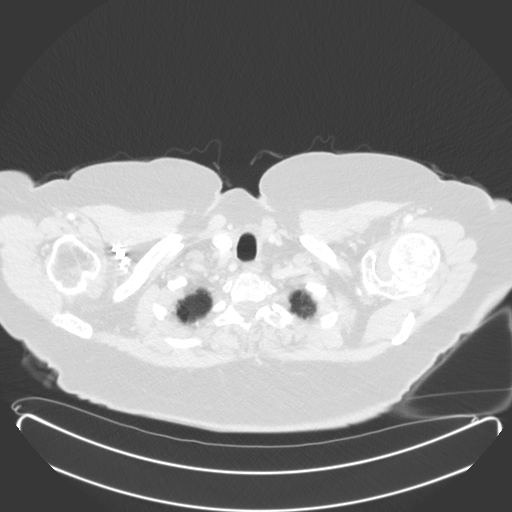

[Series 402: cor, idose (3) · coronal · 0.45mm/px · 3 of 182 slices shown]
[im 37/182  lung]
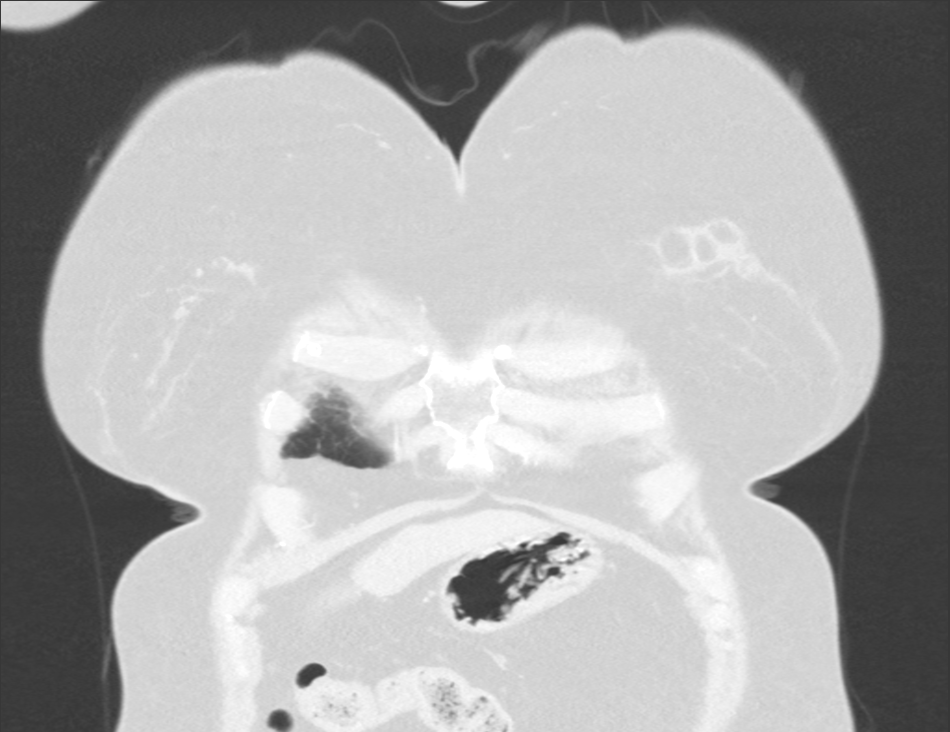
[im 73/182  lung]
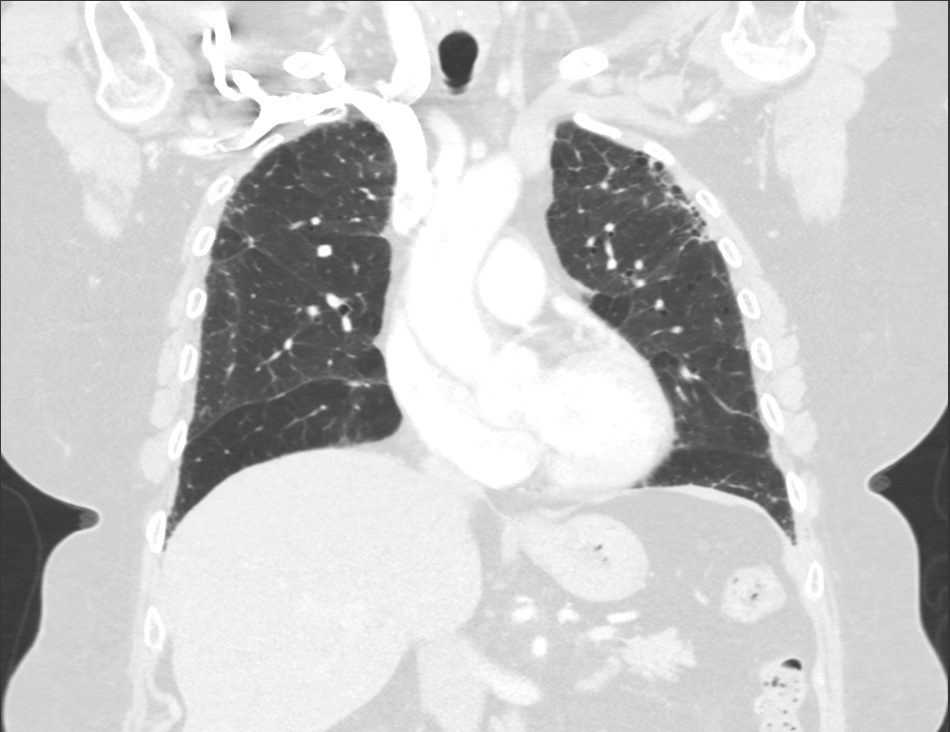
[im 109/182  lung]
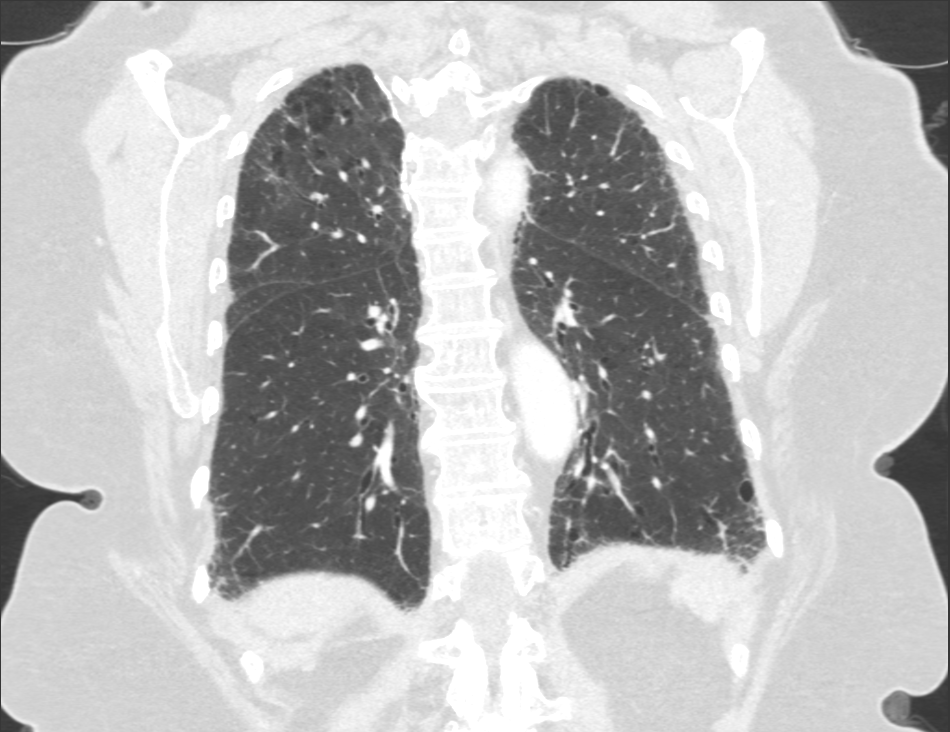

[11 of 36 positions shown; findings below may reference images not displayed]

FINDINGS: LUNGS AND PLEURA:  Right upper lobe nodular density seen on axial image #37 of 
series #4 a 1 which measures 3 mm x 6 mm and previously measured 5 mm x 9 mm. 
This has decreased in size and no new pulmonary nodules seen in the lungs. There 
remain some mild stable scarring in the periphery of the lungs as well as some 
emphysematous changes. There also stable partially calcified granuloma in the 
right upper lobe. 
MEDIASTINUM:  Prominent mediastinal lymph nodes again seen measuring up to
cm and not significantly changed. No axillary or hilar adenopathy. Normal heart 
size. No pericardial effusion. 
CHEST WALL/AXILLA: No mass or adenopathy. 
UPPER ABDOMEN: Negative. 
MUSCULOSKELETAL: No acute abnormality.
IMPRESSION: Right upper lobe nodular density seen on axial image #37 of series #4 a 1 which 
measures 3 mm x 6 mm and previously measured 5 mm x 9 mm. This has decreased in 
size and no new pulmonary nodules seen in the lungs. As this has decreased in 
size over one year this is felt to likely benign and can be followed up in one 
year as per reference below. . 
Stable partially calcific granuloma in the right upper lobe. 
Stable scarring in the periphery of lungs as well as stable emphysematous 
changes. 
Stable prominent mediastinal lymph nodes measuring up to 1.2 cm. 
REFERENCE: 
Recommendations for pulmonary nodule follow-up according to [HOSPITAL] 
Guidelines. 
Solitary nodule size: < 6 mm 
*Low-risk patients: No followup needed. 
*High-risk patients: Optional CT at 12 months. 
Solitary Nodule size: 6-8 mm 
*Low-risk patients: Followup at 6-12 months, then consider further follow-up at 
18-24 months. 
*High-risk patients: Initial followup CT at 6-12 months and then at 18-24 months 
if no change. 
Solitary Nodule size: >8 mm 
*Either low or high risk: 
*Consider follow-up CT at 3 months, and/or CT-PET, and/or biopsy. 
NOTE: 
Low risk patients: minimal or absent history of smoking and or other known risk 
factors. 
High risk patients: history of smoking or of other known risk factors (e.g. 
first degree relative with lung cancer, or exposure to asbestos, radon uranium) 
If a nodule up to 8mm is partly solid or is ground glass further follow up is 
required after 24 months to exclude possible slow growing adenocarcinoma (TETARD) 
Size is average of length and width. 
RADIATION DOSE REDUCTION: All CT scans are performed using radiation dose 
reduction techniques, when applicable.  Technical factors are evaluated and 
adjusted to ensure appropriate moderation of exposure.  Automated dose 
management technology is applied to adjust the radiation doses to minimize 
exposure while achieving diagnostic quality images.

## 2020-06-26 IMAGING — MR MRI RIGHT KNEE WITHOUT CONTRAST
4 of 5 series · 14 of 40 positions shown · IV contrast (gadolinium)
Comparison: 05/16/2020 radiographs

MRI RIGHT KNEE WITHOUT CONTRAST, 06/26/2020 [DATE]: 
CLINICAL INDICATION: Right knee pain for 5 years.
TECHNIQUE: Multiplanar, multiecho position MR images of the knee were performed 
without intravenous gadolinium enhancement.

[Series 201: survey_right · axial · 10.0mm · 0.94mm/px · z∈[-40,+150]mm · 3 of 15 slices shown]
[im 1/15]
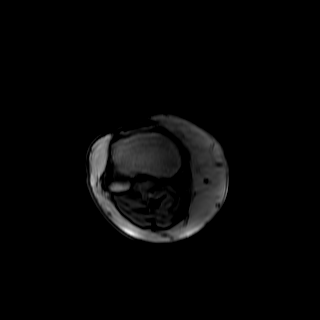
[im 10/15]
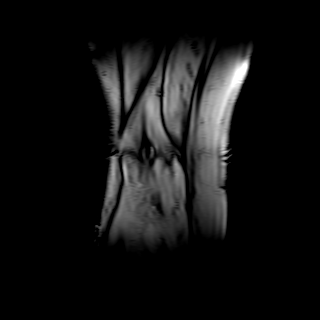
[im 15/15]
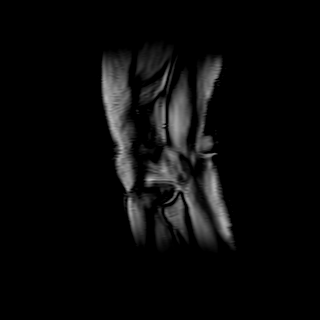

[Series 301: pdw/spair_sag-new · sagittal · 3.0mm · 0.29mm/px · 3 of 38 slices shown]
[im 6/38]
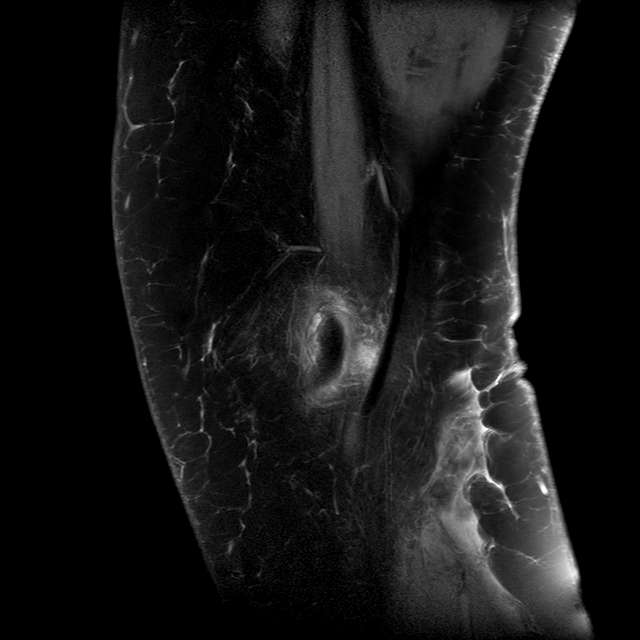
[im 22/38]
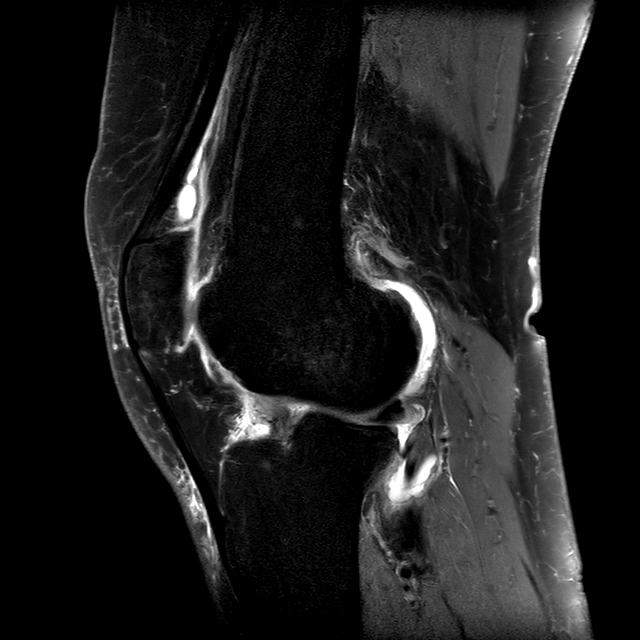
[im 32/38]
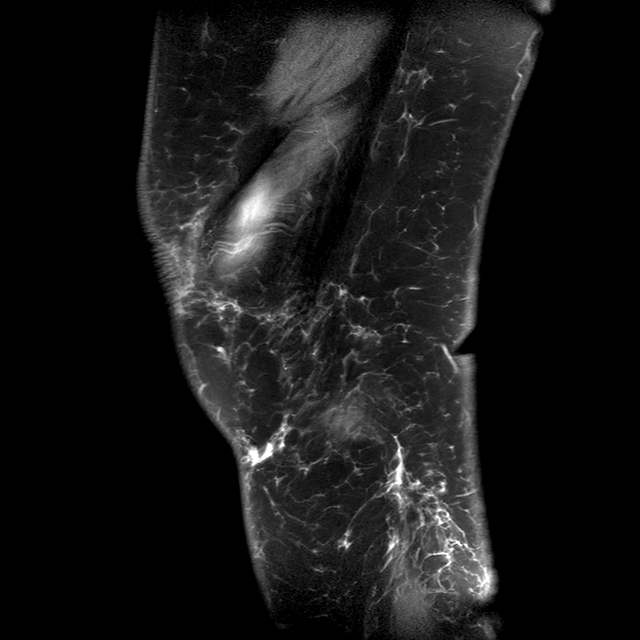

[Series 401: pdw spair cor · coronal · 3.0mm · 0.28mm/px · 3 of 45 slices shown]
[im 6/45]
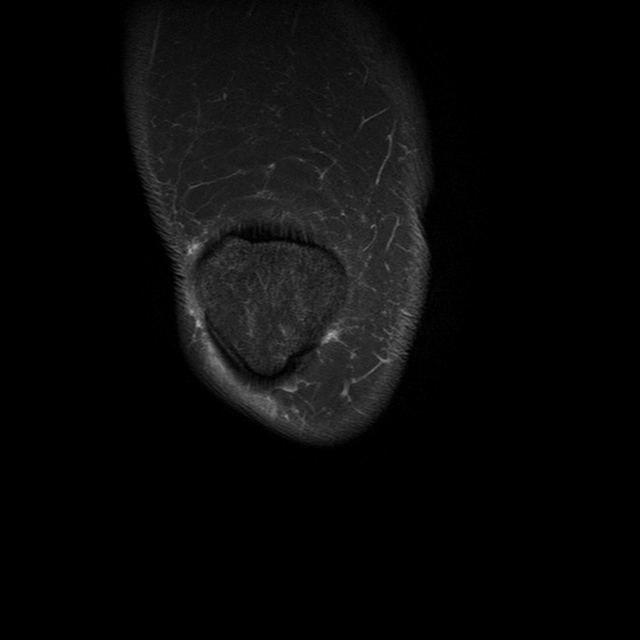
[im 23/45]
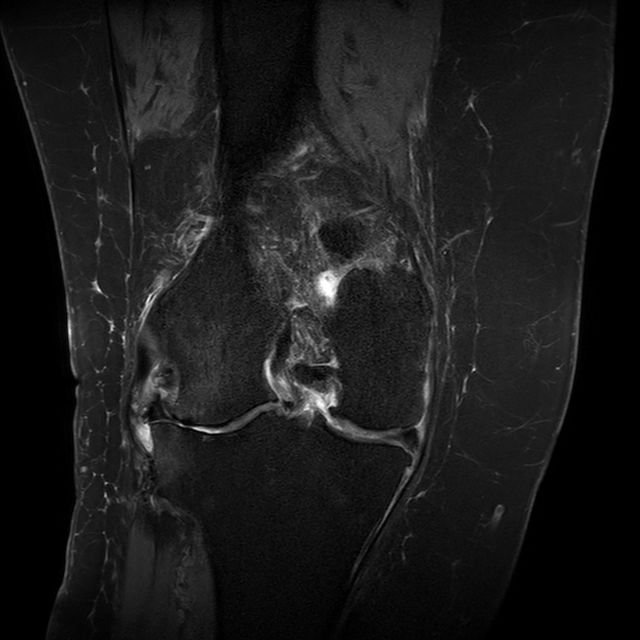
[im 39/45]
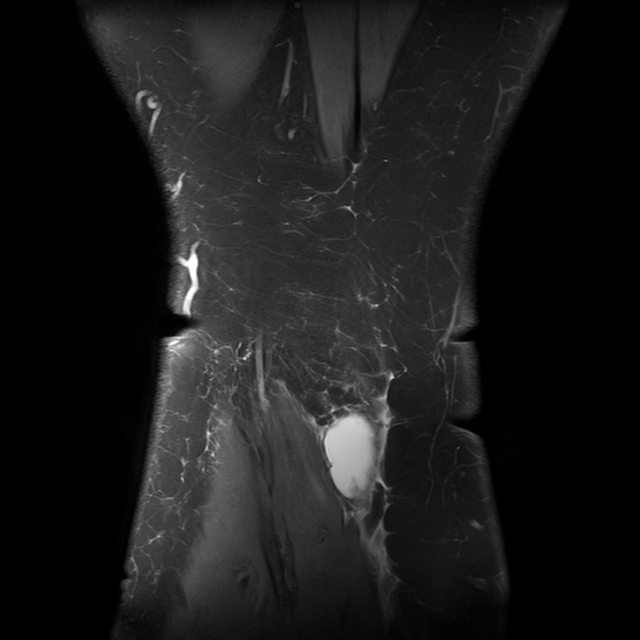

[Series 501: T1 · coronal · 3.0mm · 0.28mm/px · 5 of 45 slices shown]
[im 1/45]
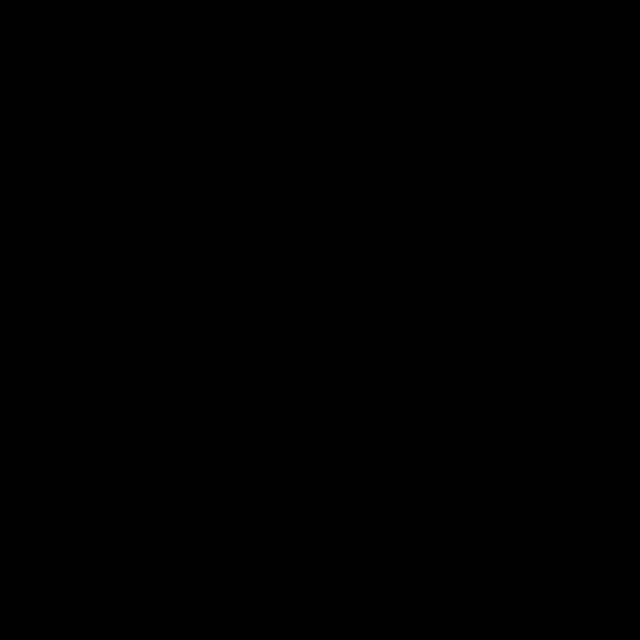
[im 6/45]
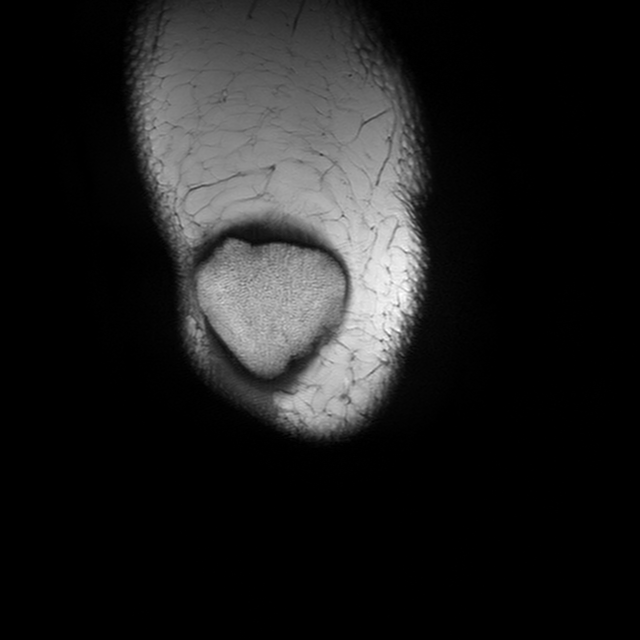
[im 12/45]
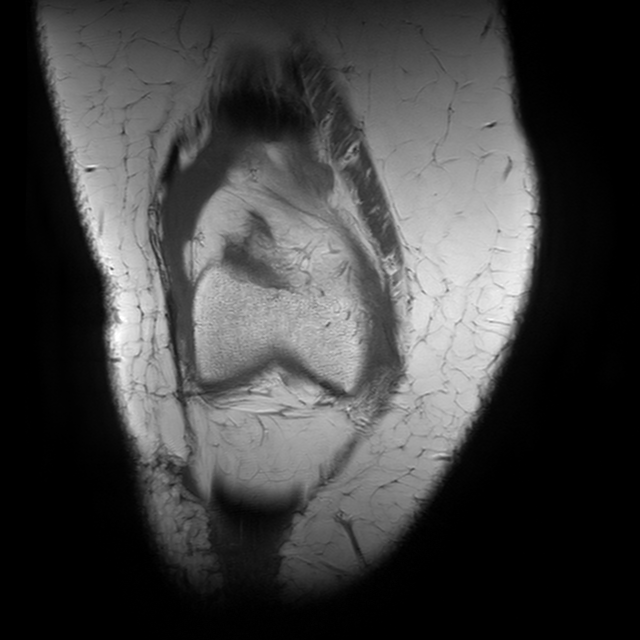
[im 23/45]
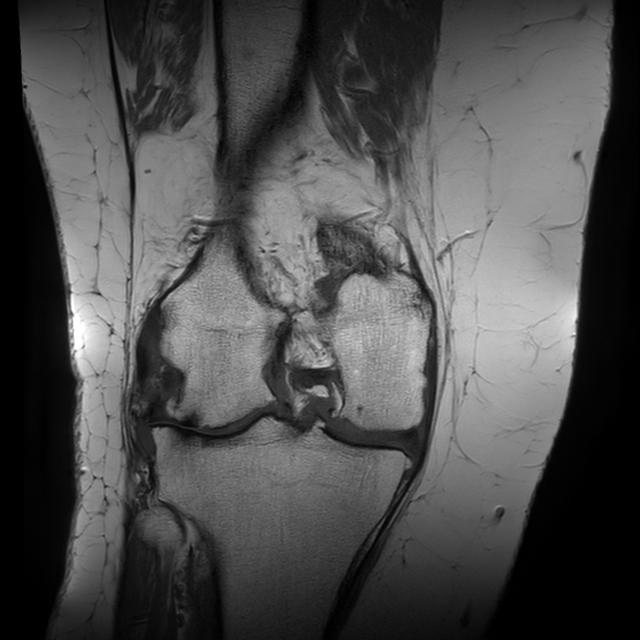
[im 39/45]
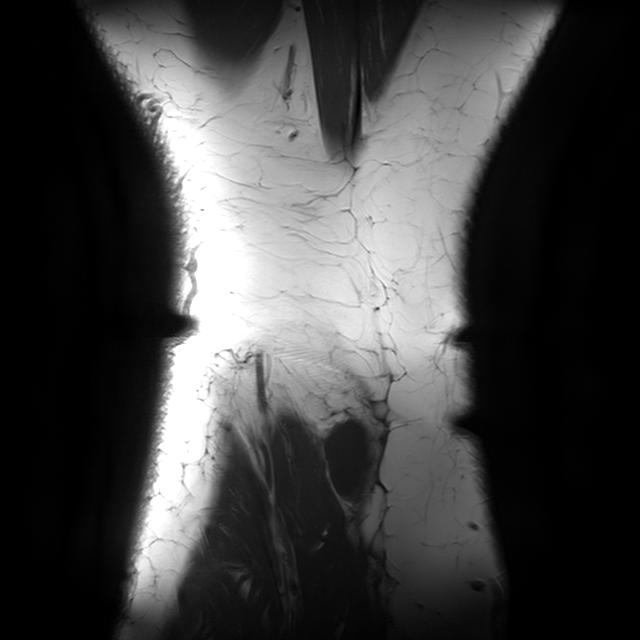

[14 of 40 positions shown; findings below may reference images not displayed]

FINDINGS: MEDIAL COMPARTMENT: Horizontal tear of the posterior horn and body of the medial 
meniscus. Up to grade III chondromalacia. Marginal and central osteophytes. 
LATERAL COMPARTMENT: Truncation and complex tearing of the anterior horn and 
body of the lateral meniscus. Horizontal tear of the posterior horn. Up to grade 
IV chondromalacia. Subcortical cysts and marrow edema-like signal changes. 
Marginal and central osteophytes. 
PATELLOFEMORAL COMPARTMENT: The patella is centrally located.  Up to grade IV 
chondromalacia of the central trochlea. Up to grade III chondromalacia patella. 
Marginal osteophytes. 
TIBIOFIBULAR COMPARTMENT: Negative. 
LIGAMENTS: The anterior cruciate ligament is intact. The posterior cruciate 
ligament is intact. The medial collateral ligament and lateral collateral 
ligaments are preserved. 
EXTENSOR MECHANISM: The quadriceps and patellar tendon are preserved. The medial 
and lateral retinacula are intact. 
POSTEROMEDIAL CORNER: The semimembranosus and pes anserine tendons are 
preserved. The posterior oblique ligament and posterior medial joint capsule are 
intact. 
POSTEROLATERAL CORNER: The popliteal tendon and popliteofibular ligament are 
intact. The biceps femoris is negative. 
BONES: Normal bone marrow signal intensity. No fracture or contusion or stress 
response. 
ADDITIONAL FINDINGS: No knee joint effusion. 1.5 x 0.6 x 2.6 cm synovial cyst 
versus ganglion in the suprapatellar fat pad. 3.3 x 1.4 x 7.6 cm multiloculated 
and dissecting popliteal cyst. Multiloculated ganglion versus synovial cyst 
posterior to the posterior meniscus. The musculature is normal without mass, 
signal abnormality or atrophy. Subcutaneous tissues are negative. Neurovascular 
bundles are negative.
IMPRESSION: 1.  Horizontal tear of the posterior horn and body of the medial meniscus. 
2.  Truncation and complex tear of the anterior horn/body of the lateral 
meniscus and horizontal tear of the posterior horn. 
3.  Marked lateral compartment and moderate medial and patellofemoral joint 
degenerative change. 
4.  Popliteal cyst and synovial cysts versus ganglia in the suprapatellar fat 
pad and posterior to the medial meniscus.

## 2021-01-15 IMAGING — MG MAMMOGRAPHY DIAGNOSTIC BILATERAL 3[PERSON_NAME]
8 series · 8 of 24 positions shown · non-contrast
Comparison: Comparison was made to prior examinations.

________________________________________________________________________________________________ 
MAMMOGRAPHY DIAGNOSTIC BILATERAL 3DONTRELL ZHONG, 01/15/2021 [DATE]: 
CLINICAL INDICATION: History of RIGHT lumpectomy for malignancy 8489. History of 
bilateral breast reduction.
TECHNIQUE: Digital bilateral mammograms and 3-D Tomosynthesis were obtained. 
These were interpreted both primarily and with the aid of computer-aided 
detection system.  
BREAST DENSITY: (Level B) There are scattered areas of fibroglandular density.

[R CC]
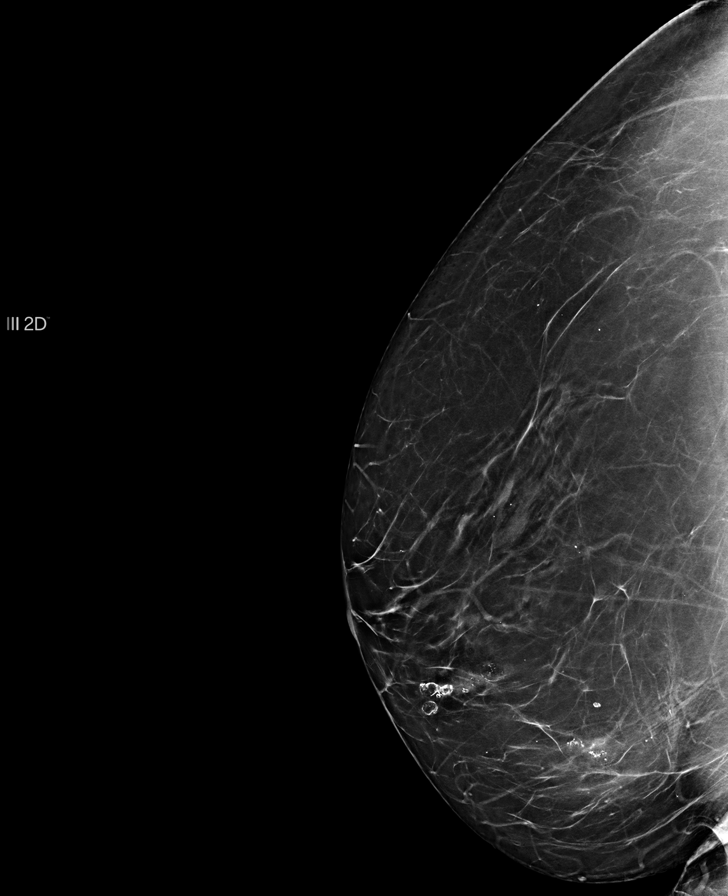

[L CC]
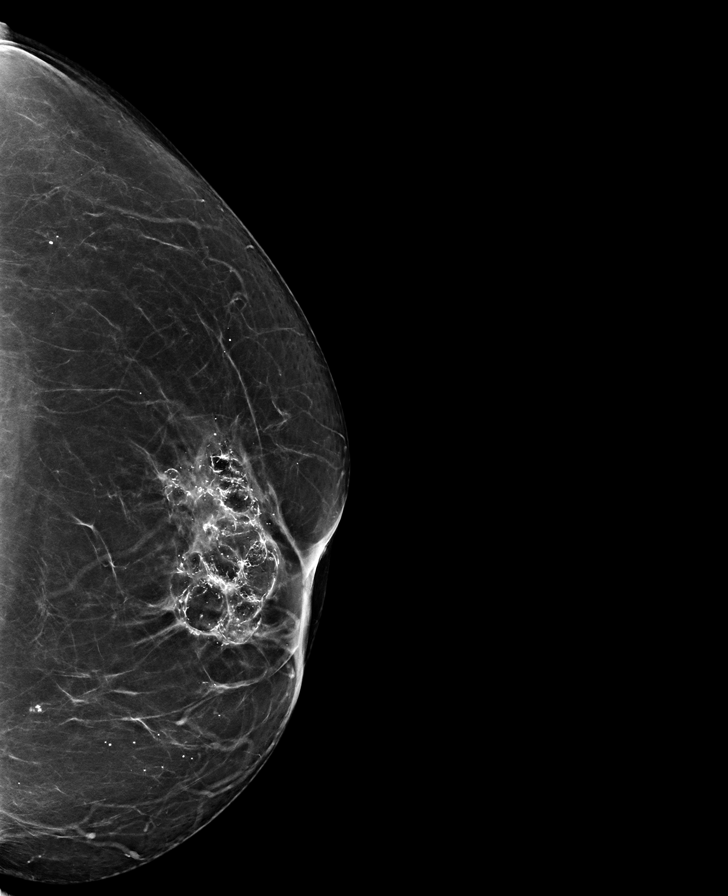

[R MLO]
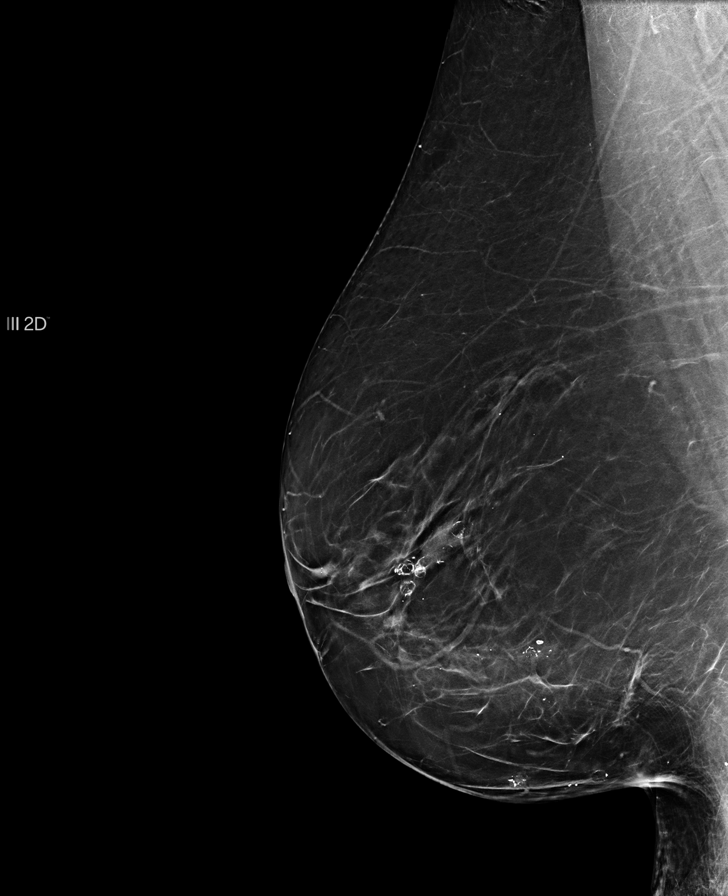

[L MLO]
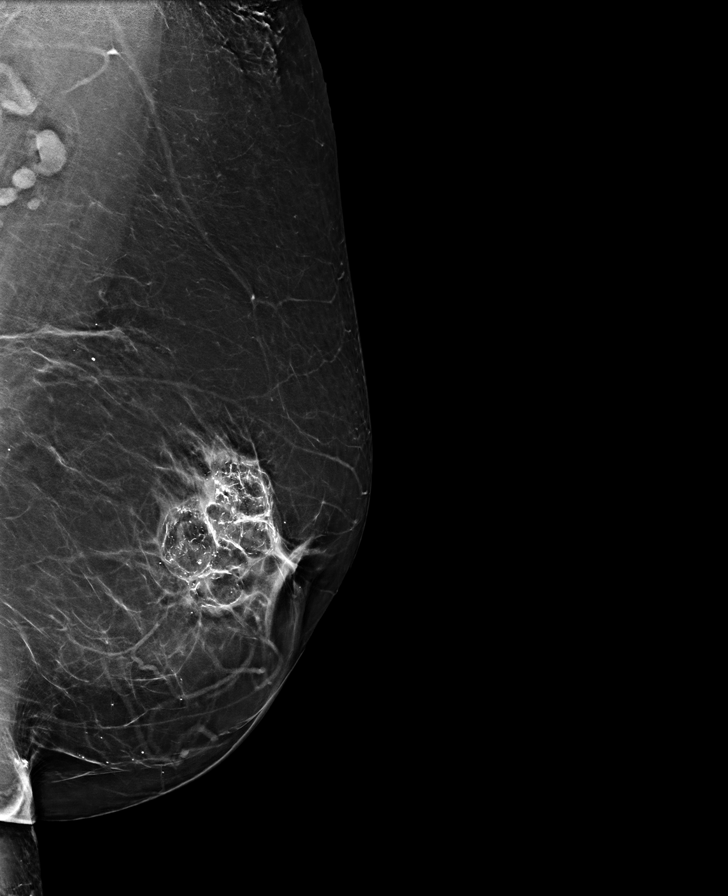

[R CC tomo · tomo slice 44/87.0]
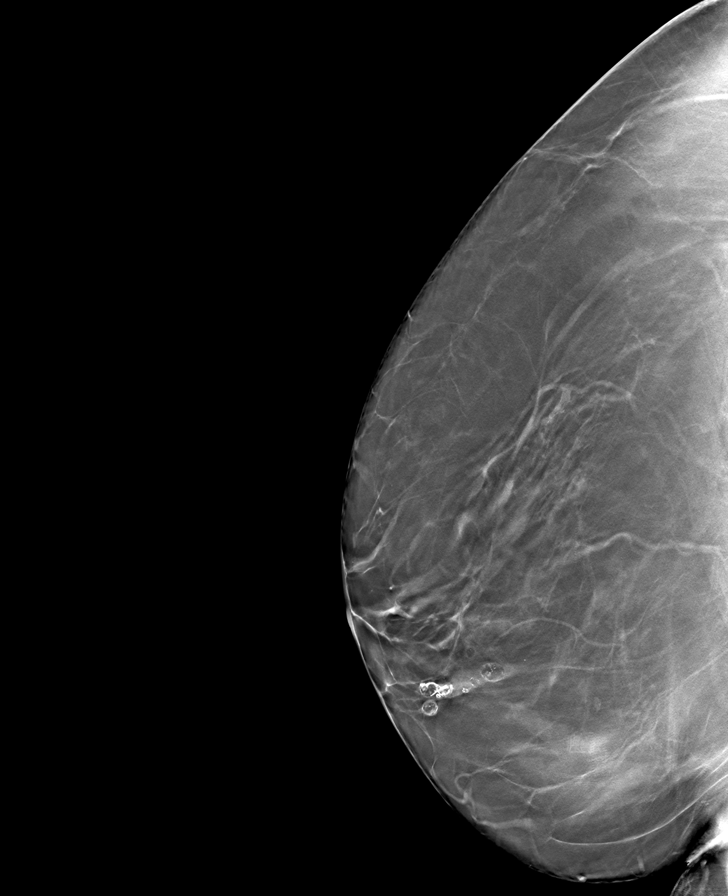

[R MLO tomo · tomo slice 45/89.0]
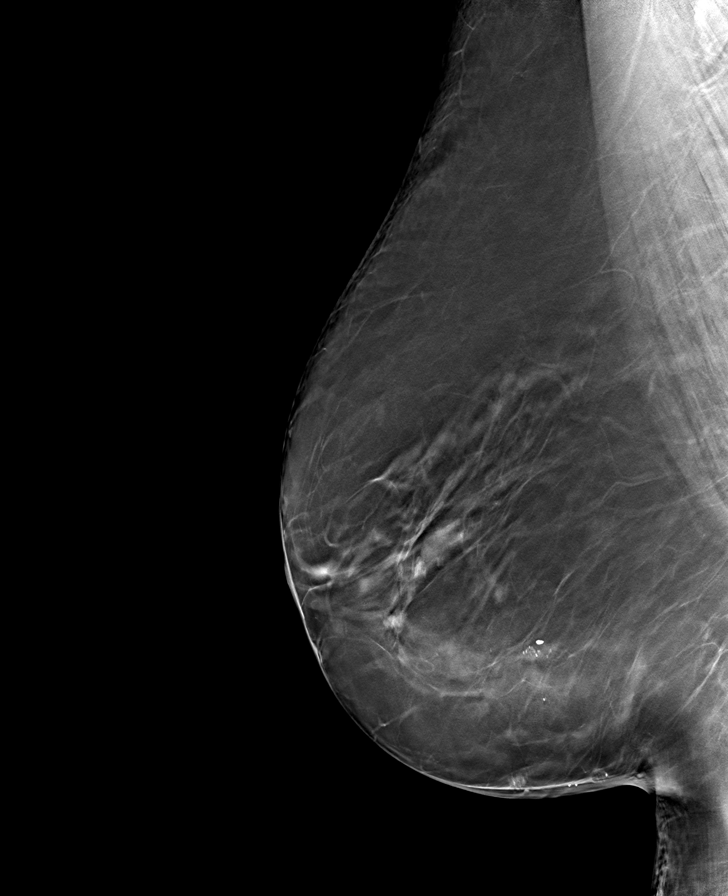

[L MLO tomo · tomo slice 45/89.0]
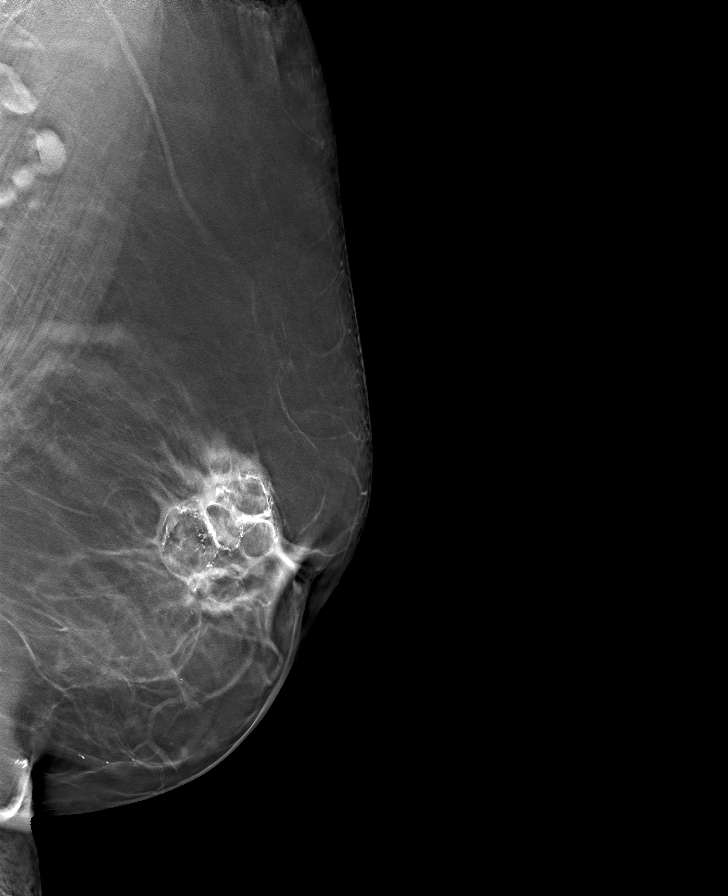

[L CC tomo · tomo slice 44/87.0]
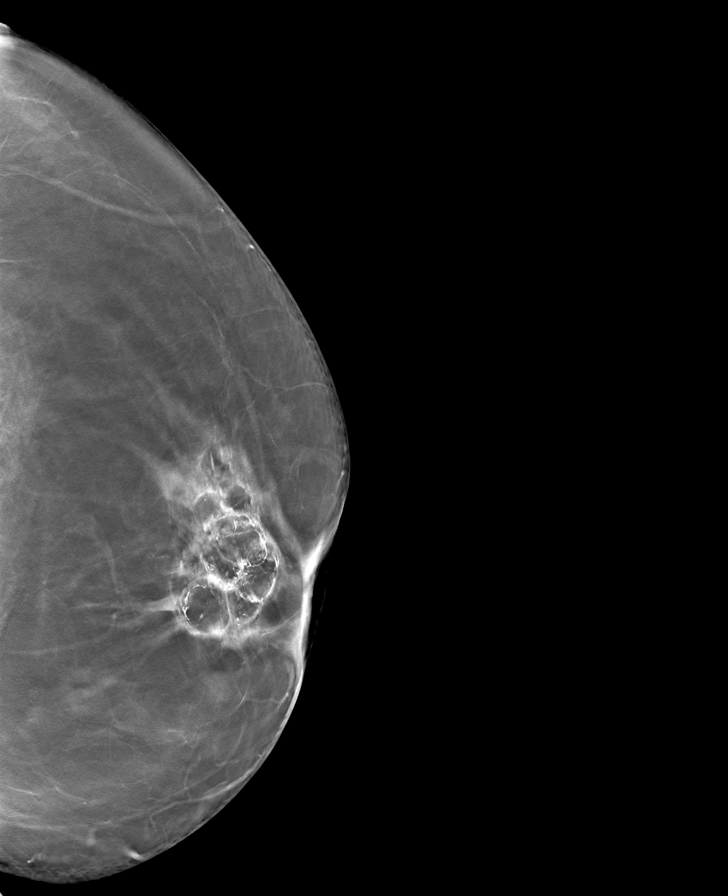

[8 of 24 positions shown; findings below may reference images not displayed]

FINDINGS: Bilateral partially calcified fat necrosis is present much more 
prominently on the LEFT side where it is producing nipple retraction. The fat 
necrosis calcifications have shown interval increase. There has been interval 
RIGHT lumpectomy and the previously described small spiculated mass is no longer 
present. Currently there is no suspicious abnormalities in either breast.
IMPRESSION: (BI-RADS 2) Benign findings. Routine mammographic follow-up is recommended.

## 2021-05-23 IMAGING — CT CT CHEST WITH CONTRAST
1 of 4 series · 4 of 36 positions shown, 5 images · IV contrast (Iodine)
Comparison: Chest CT from May 19, 2020.

________________________________________________________________________________________________ 
CT CHEST WITH CONTRAST, 05/23/2021 [DATE]: 
CLINICAL INDICATION: Pleuritic chest pain. Pulmonary fibrosis. 
A search for DICOM formatted images was conducted for prior CT imaging studies 
completed at a non-affiliated media free facility.
TECHNIQUE: The chest was scanned from base of neck through the lung bases with 
100 mL of Isovue 300 injected intravenously on a high resolution low dose CT 
scanner. Routine MPR and MIP 3D renderings were reconstructed on an independent 
workstation with concurrent physician supervision.

[Series 406: mips · axial · 0.94mm/px · z∈[+1102,+1262]mm · 4 of 40 slices shown, 5 images]
[im 8/40  mediastinal]
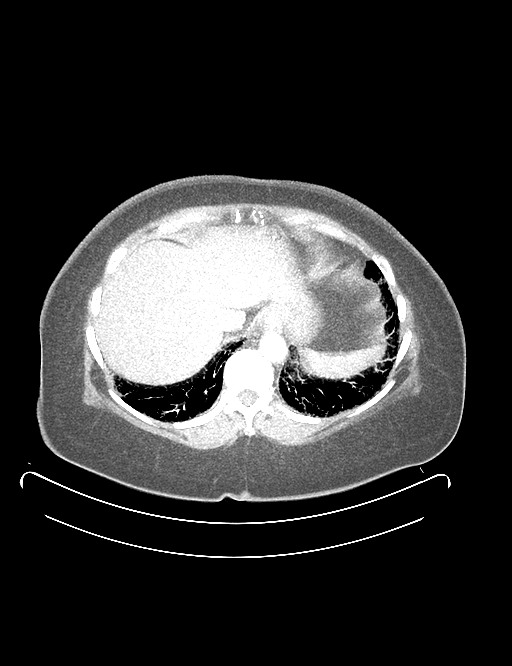
[im 8/40  lung]
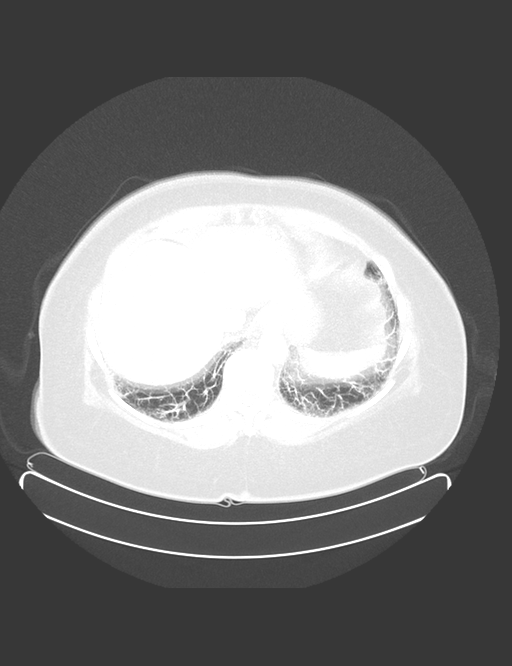
[im 16/40  lung]
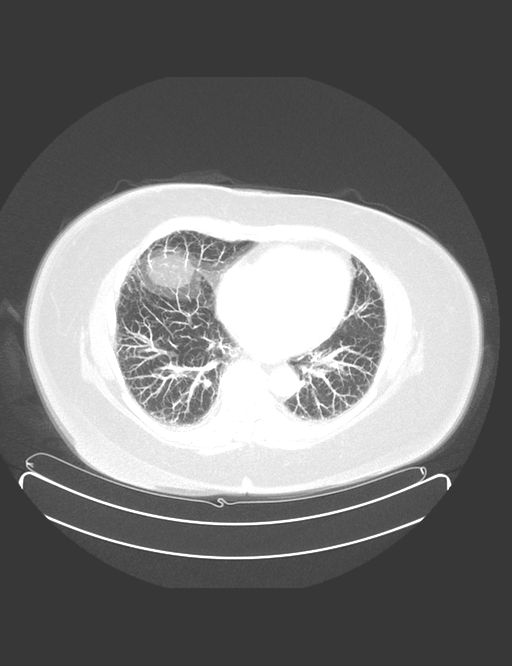
[im 24/40  lung]
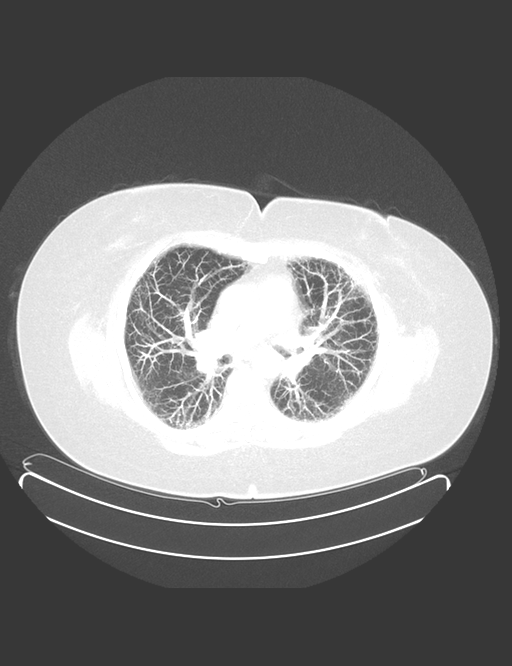
[im 32/40  lung]
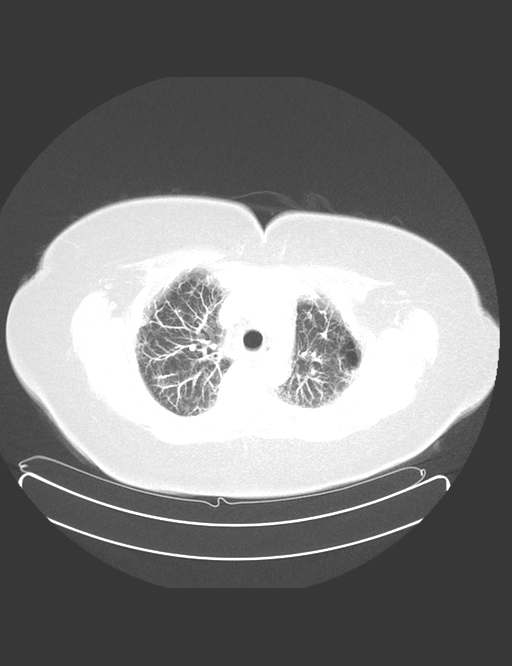

[4 of 36 positions shown; findings below may reference images not displayed]

FINDINGS: --------------------------------------------------------------------------- 
LUNGS AND PLEURA:   Bilateral subpleural increased interstitial markings are 
present. There is also evidence of bronchiectasis at the left lung base. 
Findings are stable. No definite honeycombing. Cystic changes at the right lung 
apex and left upper lobe laterally. 
MEDIASTINUM:  Mediastinal and hilar lymphadenopathy is present. Precarinal lymph 
node measures 0.4 cm in short axis, stable. Right hilar lymph node measures
cm in short axis, stable.   
CARDIOVASCULAR:  Heart size is normal. Coronary artery calcification. No 
aneurysm.   
LOWER NECK:  Absent thyroid gland. Right level IV stable 0.8 cm short axis lymph 
node. 
CHEST WALL/AXILLA:  Stable asymmetric left breast density. 
UPPER ABDOMEN:  Subcentimeter stable low-attenuation lesions in the liver, 
likely cysts. 
OSSEOUS STRUCTURES:  No acute osseous abnormality. Scattered degenerative 
changes.  
---------------------------------------------------------------------------
IMPRESSION: Stable findings of interstitial lung disease (probable UIP pattern). 
RADIATION DOSE REDUCTION: All CT scans are performed using radiation dose 
reduction techniques, when applicable.  Technical factors are evaluated and 
adjusted to ensure appropriate moderation of exposure.  Automated dose 
management technology is applied to adjust the radiation doses to minimize 
exposure while achieving diagnostic quality images.

## 2022-01-16 IMAGING — MG MAMMOGRAPHY DIAGNOSTIC BILATERAL 3[PERSON_NAME]
6 of 10 series · 6 of 30 positions shown · non-contrast
Comparison: 01/15/2021 and dating back to 10/24/2011

________________________________________________________________________________________________ 
MAMMOGRAPHY DIAGNOSTIC BILATERAL 3DROZDOPHILA SINKO, 01/16/2022 [DATE]: 
CLINICAL INDICATION: History of right breast cancer with lumpectomy and 
radiation therapy in 1910. Follow-up exam.
TECHNIQUE: Digital bilateral mammograms and 3-D Tomosynthesis were obtained. 
These were interpreted both primarily and with the aid of computer-aided 
detection system.  
BREAST DENSITY: (Level B) There are scattered areas of fibroglandular density.

[R CV]
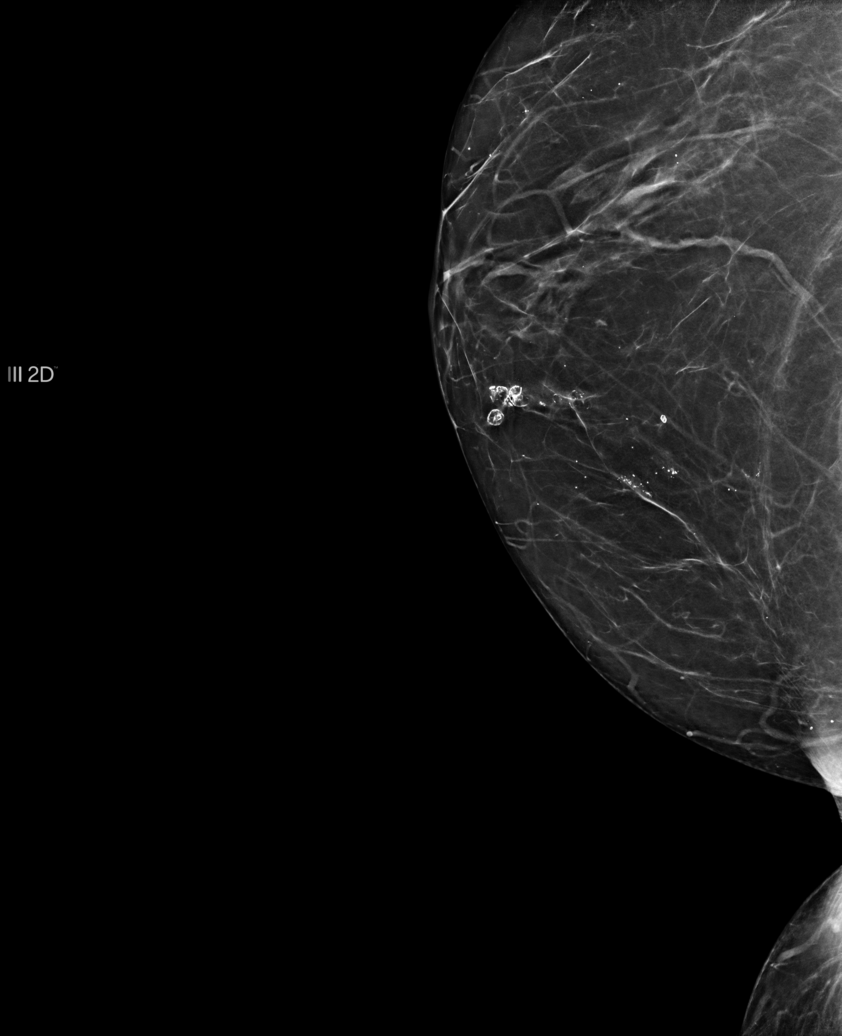

[R CC]
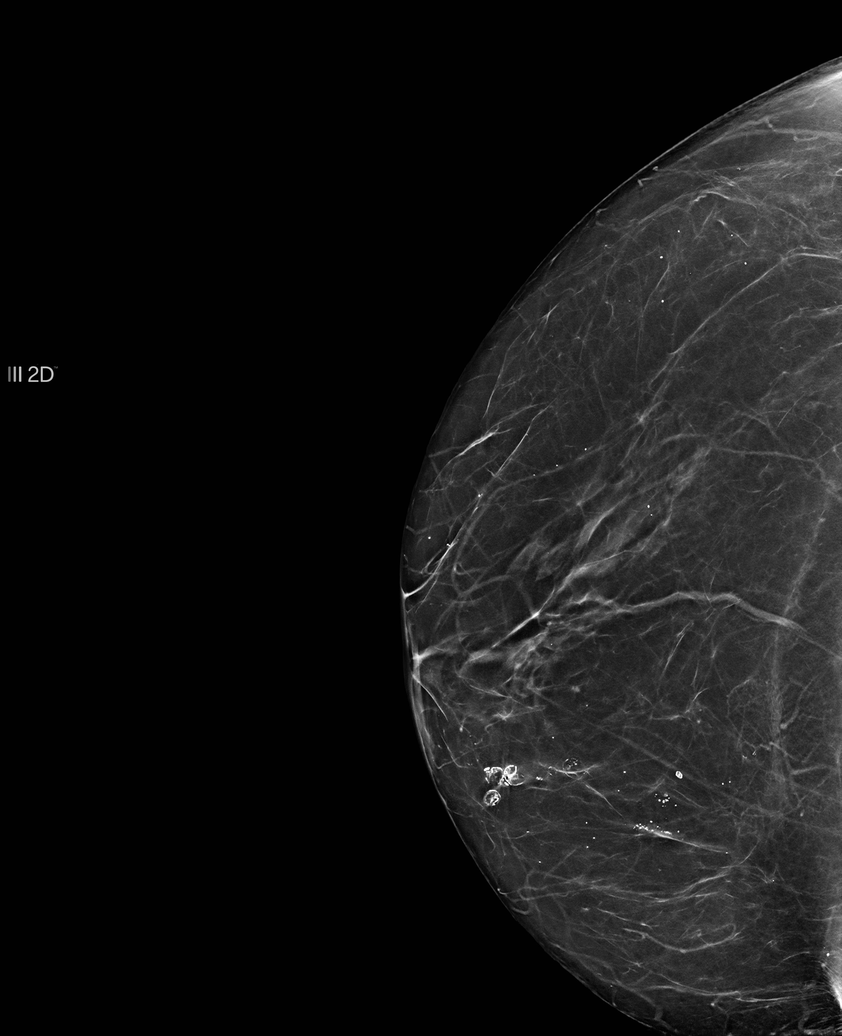

[L MLO]
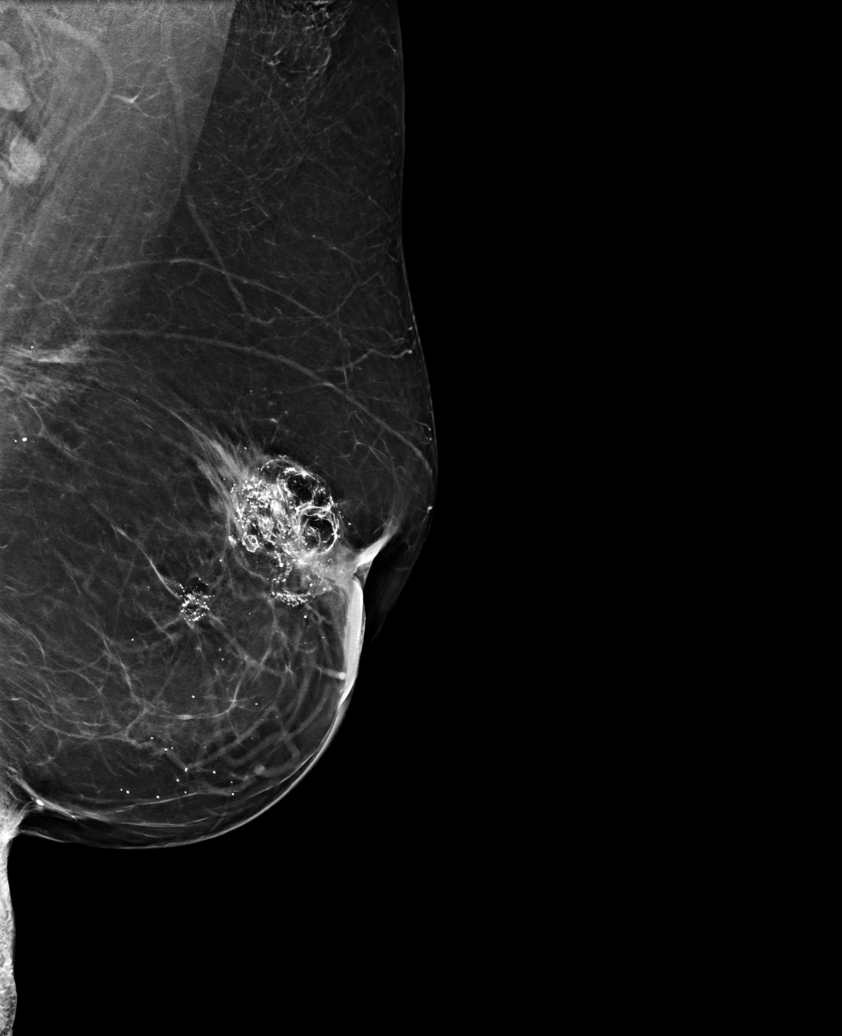

[R MLO]
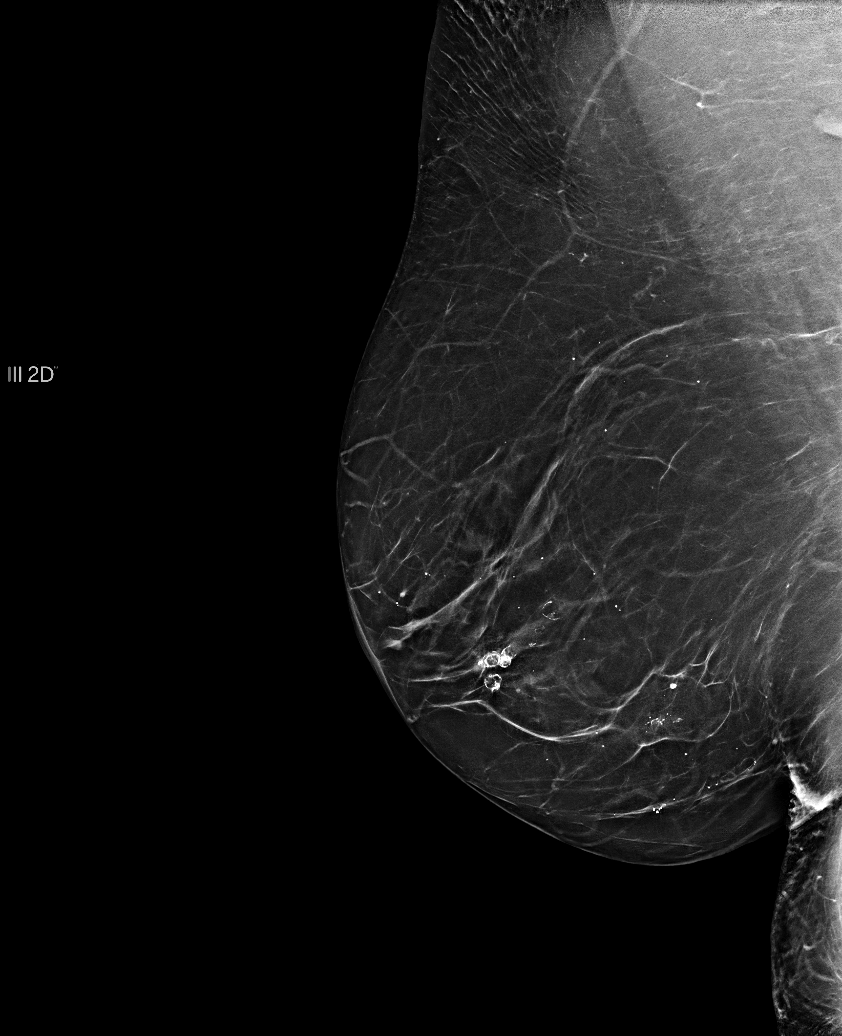

[L CC]
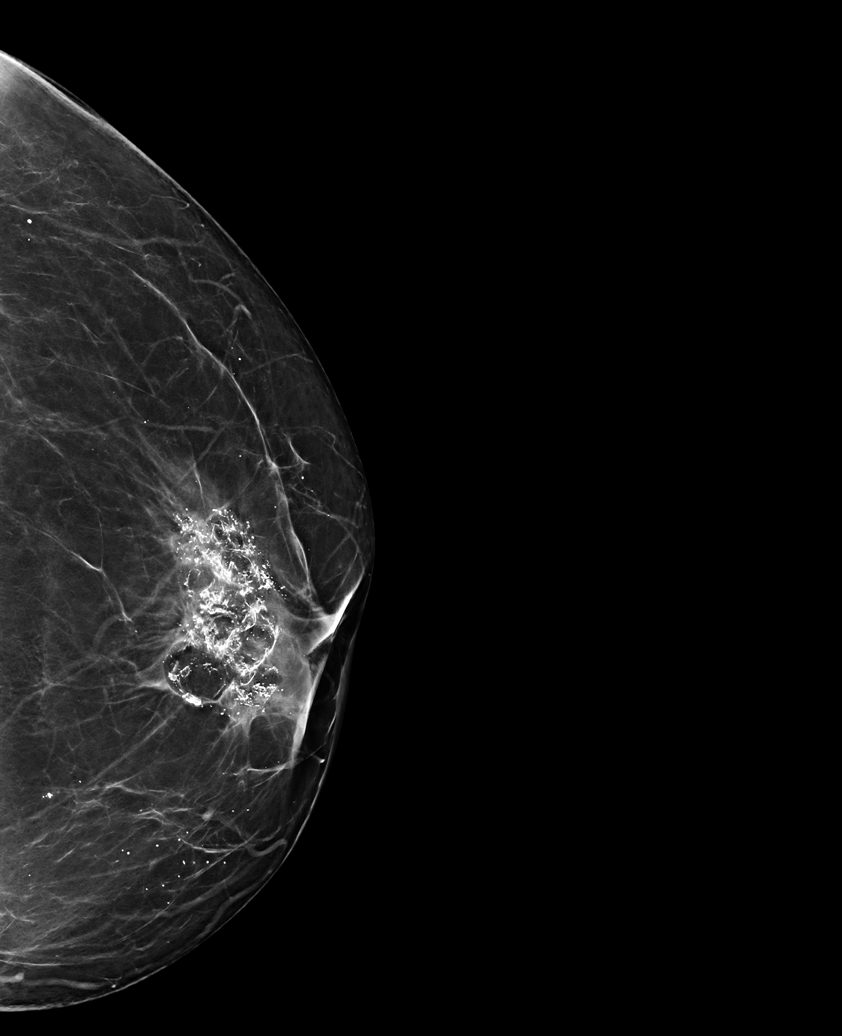

[L CC tomo · tomo slice 42/83.0]
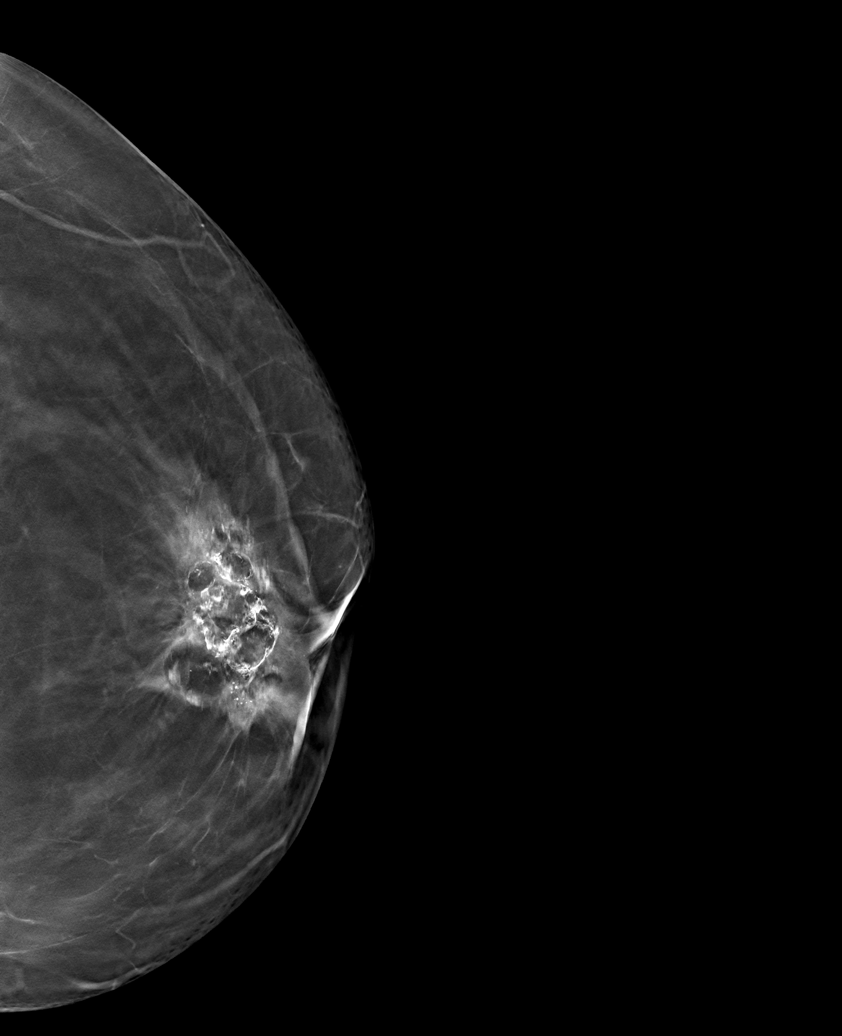

[6 of 30 positions shown; findings below may reference images not displayed]

FINDINGS: Posttreatment changes left breast with coarse calcifications again 
seen compatible with fat necrosis. No suspicious mass, calcifications or area of 
architectural distortion.
IMPRESSION: No mammographic findings suggestive for malignancy. 
(BI-RADS 2) Benign findings. Routine mammographic follow-up is recommended.

## 2022-02-07 IMAGING — MR MRI LUMBAR SPINE WITHOUT CONTRAST
4 of 6 series · 13 of 48 positions shown · IV contrast (gadolinium)
Comparison: None.

________________________________________________________________________________________________ 
MRI LUMBAR SPINE WITHOUT CONTRAST, 02/07/2022 [DATE]: 
CLINICAL INDICATION: Low back pain after lifting a bed.
TECHNIQUE: Multiplanar, multiecho position MR images of the lumbar spine were 
performed without intravenous gadolinium enhancement. Patient was scanned on a 
3T magnet.

[Series 101: survey · axial · 10.0mm · 1.39mm/px · z∈[-15,+199]mm · 4 of 9 slices shown]
[im 1/9]
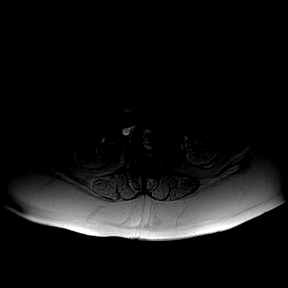
[im 3/9]
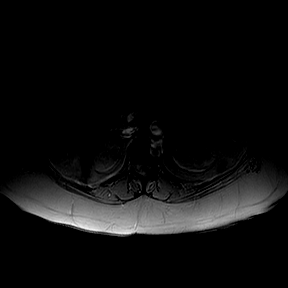
[im 6/9]
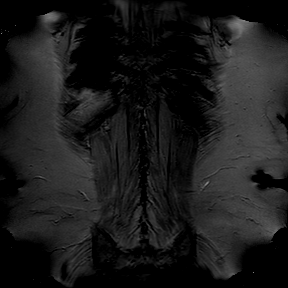
[im 9/9]
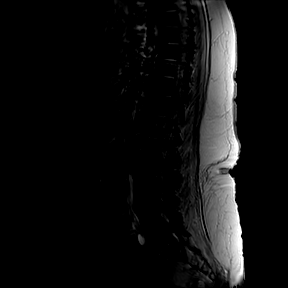

[Series 201: t2w_cor-surv · coronal · 6.0mm · 0.50mm/px · 2 of 5 slices shown]
[im 1/5]
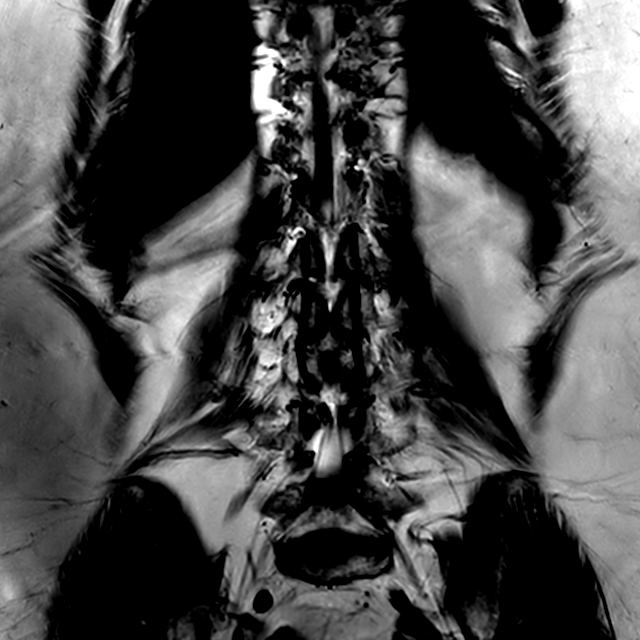
[im 5/5]
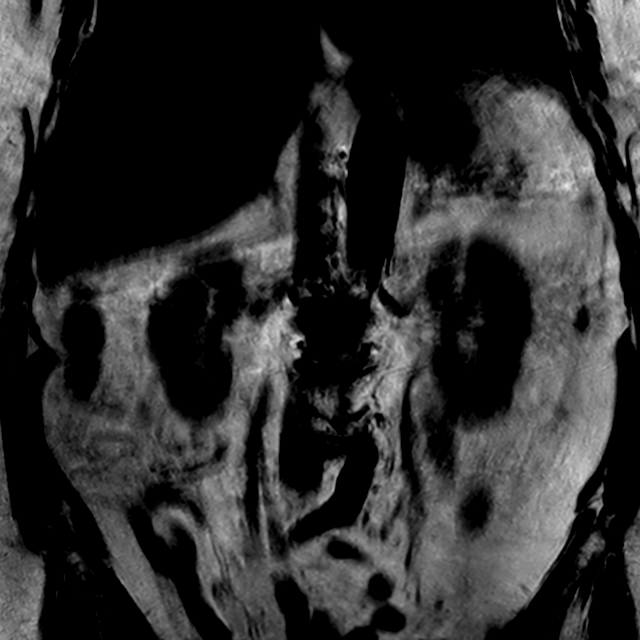

[Series 301: t1w_tse sag · sagittal · 4.0mm · 0.40mm/px · 4 of 17 slices shown]
[im 1/17]
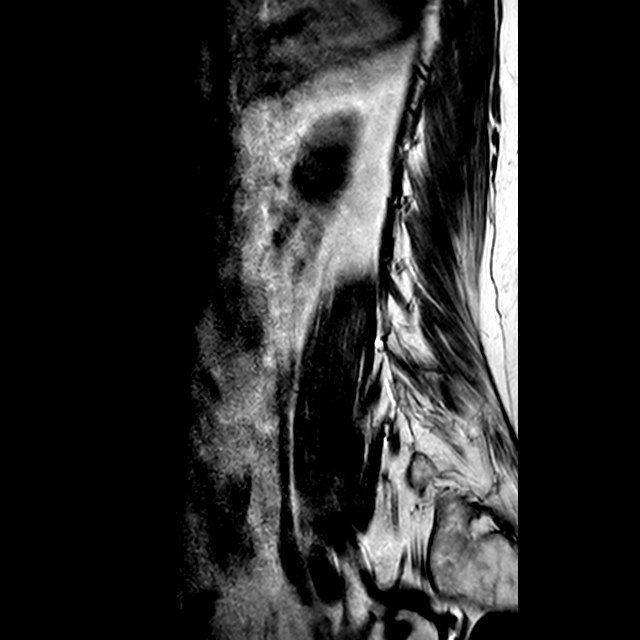
[im 3/17]
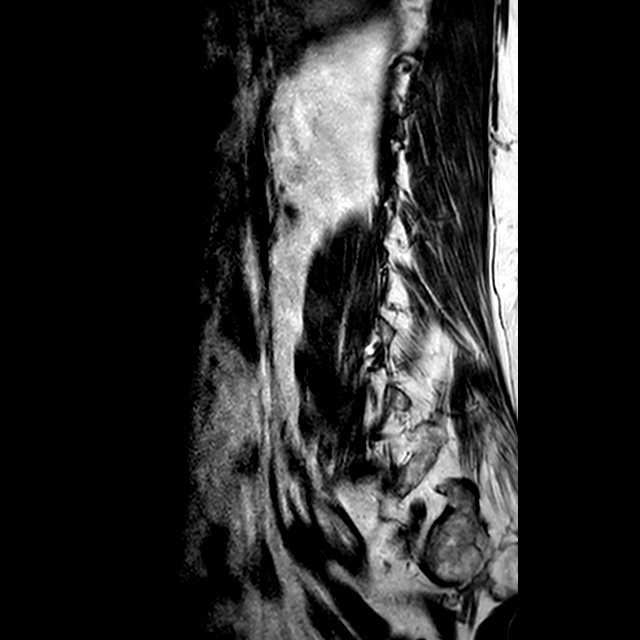
[im 9/17]
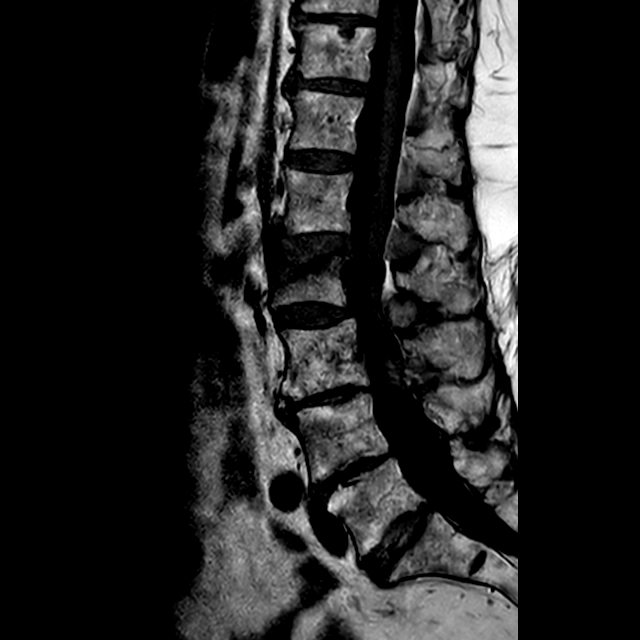
[im 15/17]
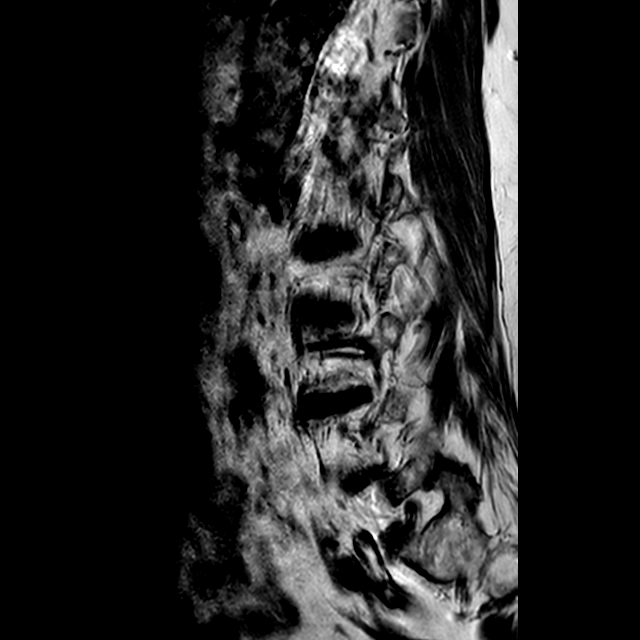

[Series 401: t2w_tse sag · sagittal · 4.0mm · 0.30mm/px · 3 of 17 slices shown]
[im 3/17]
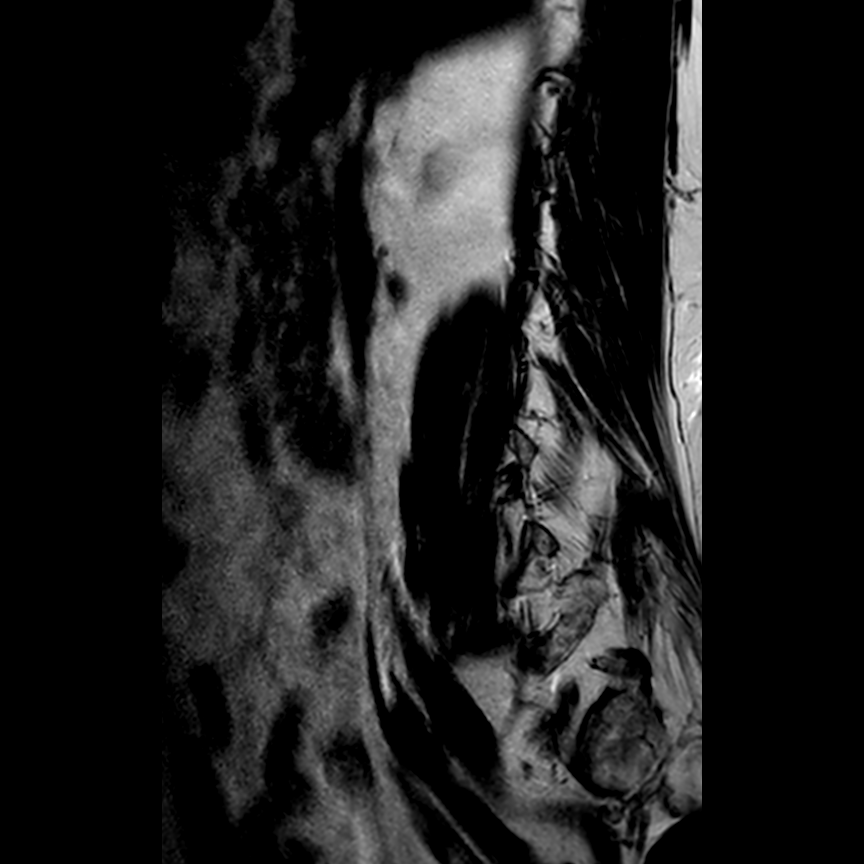
[im 9/17]
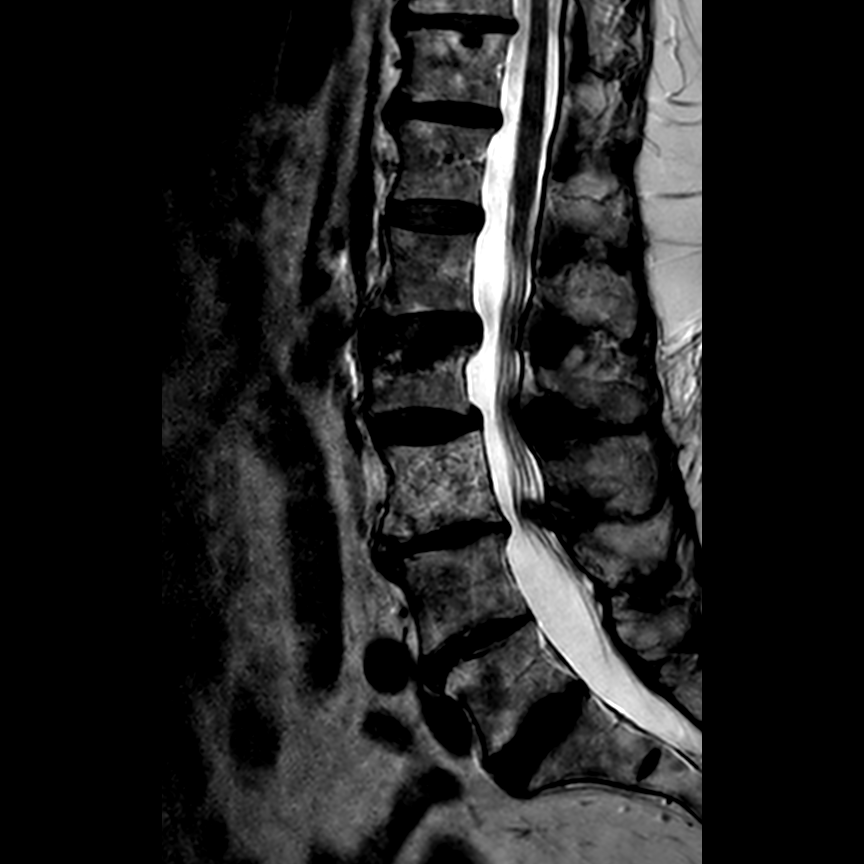
[im 15/17]
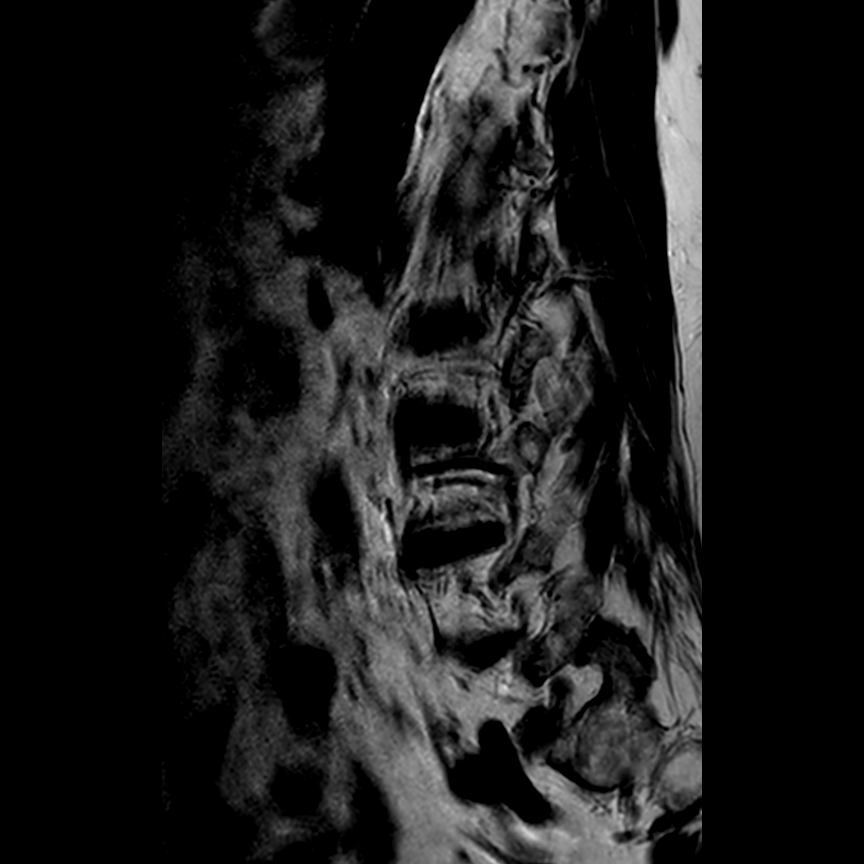

[13 of 48 positions shown; findings below may reference images not displayed]

FINDINGS: -------------------------------------------------------------------------------- 
------ 
GENERAL: 
Nomenclature is based on 5 lumbar type vertebral bodies.     
ALIGNMENT: Normal. 
VERTEBRAL BODY HEIGHT: Compression fracture of L2 superior endplate with 
presence of edema from acuity, mild loss of height anteriorly, moderate loss of 
height centrally, mild loss of height posteriorly.  
MARROW SIGNAL: Hemangioma, large, within L3. 
CORD SIGNAL: Normal distal spinal cord and cauda equina.  Conus terminates at L1 
inferior endplate level. 
ADDITIONAL FINDINGS: None. 
Modic I-II: Modic change at L3-L4, L4-L5, L5-S1. 
Ligamentum Flavum > 2.5 mm: All levels. 
-------------------------------------------------------------------------------- 
------ 
SEGMENTAL: 
T12-L1: Trace disc bulge. No significant central canal narrowing.  No 
significant right neural foraminal narrowing. No significant left neural 
foraminal narrowing.  
L1-L2: Retropulsion of superior endplate with disc bulge. Bilateral facet 
hypertrophy. Mild central canal narrowing.  No significant right neural 
foraminal narrowing. No significant left neural foraminal narrowing.  
L2-L3: Uncovering of the disc space with disc bulge. Bilateral facet 
hypertrophy. Mild central canal narrowing.  No significant right neural 
foraminal narrowing. No significant left neural foraminal narrowing.  
L3-L4: Loss of disc height with disc bulge. Bilateral facet hypertrophy. Mild 
central canal narrowing.  No significant right neural foraminal narrowing. No 
significant left neural foraminal narrowing.  
L4-L5: Loss of disc height. Bilateral facet hypertrophy. No significant central 
canal narrowing.  No significant right neural foraminal narrowing. No 
significant left neural foraminal narrowing.  
L5-S1: Disc bulge. Left facet hypertrophy. No significant central canal 
narrowing.  No significant right neural foraminal narrowing. No significant left 
neural foraminal narrowing.  
-------------------------------------------------------------------------------- 
------
IMPRESSION: 1.  Acute compression fracture of L2 superior endplate. 
2.  Discogenic/degenerative changes as above. 
3.  No moderate or severe central canal narrowing at any level.  
4.  No significant neural foraminal narrowing at any level.

## 2023-01-22 IMAGING — MG MAMMOGRAPHY DIAGNOSTIC BILATERAL 3[PERSON_NAME]
8 series · 8 of 24 positions shown · non-contrast
Comparison: Comparison was made to prior examinations.

________________________________________________________________________________________________ 
MAMMOGRAPHY DIAGNOSTIC BILATERAL 3MARICEL MAQUEDA, 01/22/2023 [DATE]: 
CLINICAL INDICATION: History of right breast carcinoma in 1000.
TECHNIQUE: Digital bilateral mammograms and 3-D Tomosynthesis were obtained. 
These were interpreted both primarily and with the aid of computer-aided 
detection system.  
BREAST DENSITY: (Level B) There are scattered areas of fibroglandular density.

[R CC]
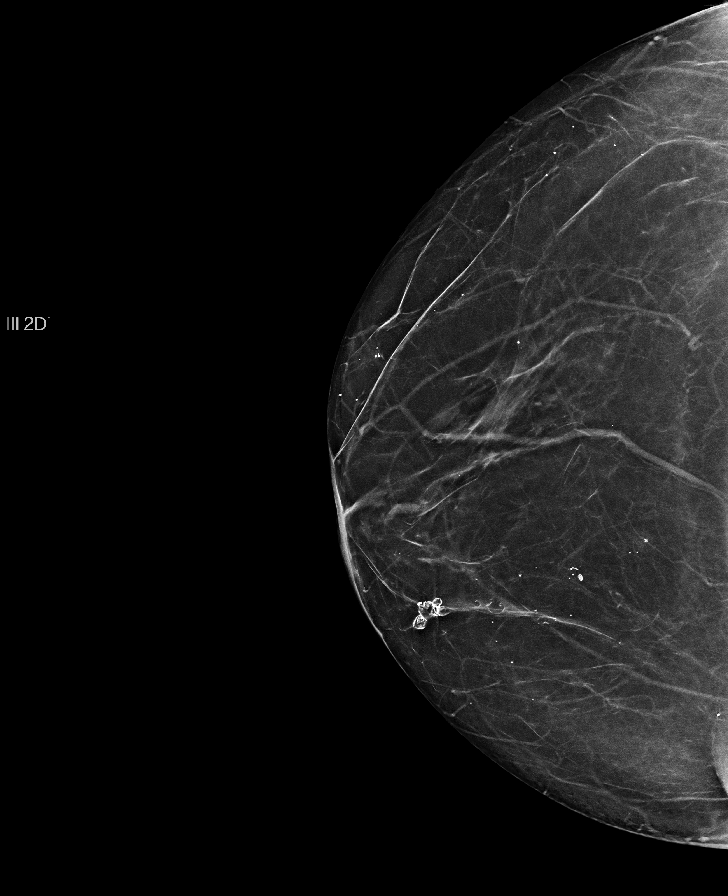

[R MLO]
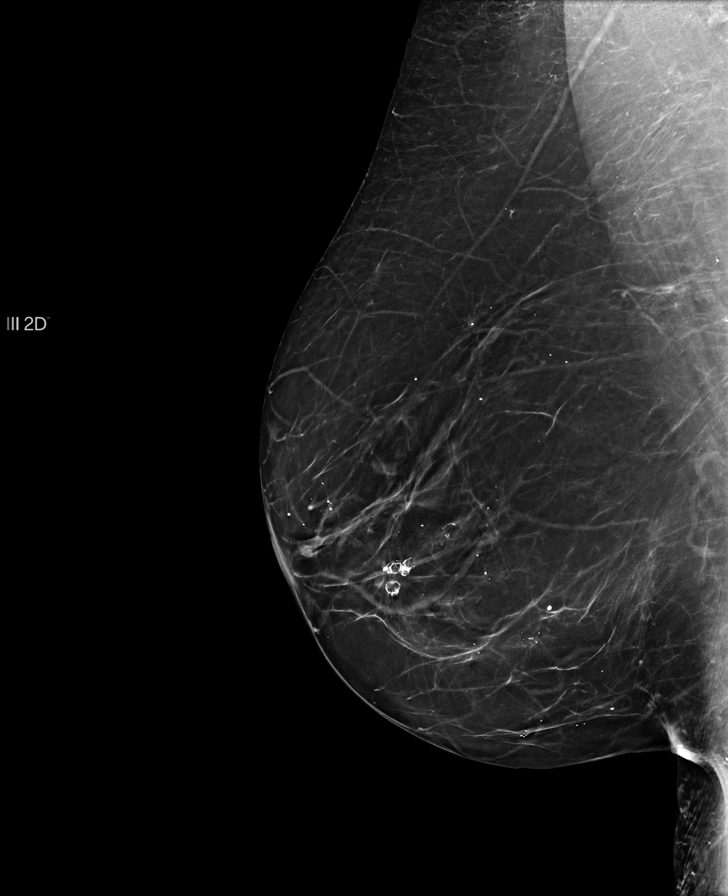

[L MLO]
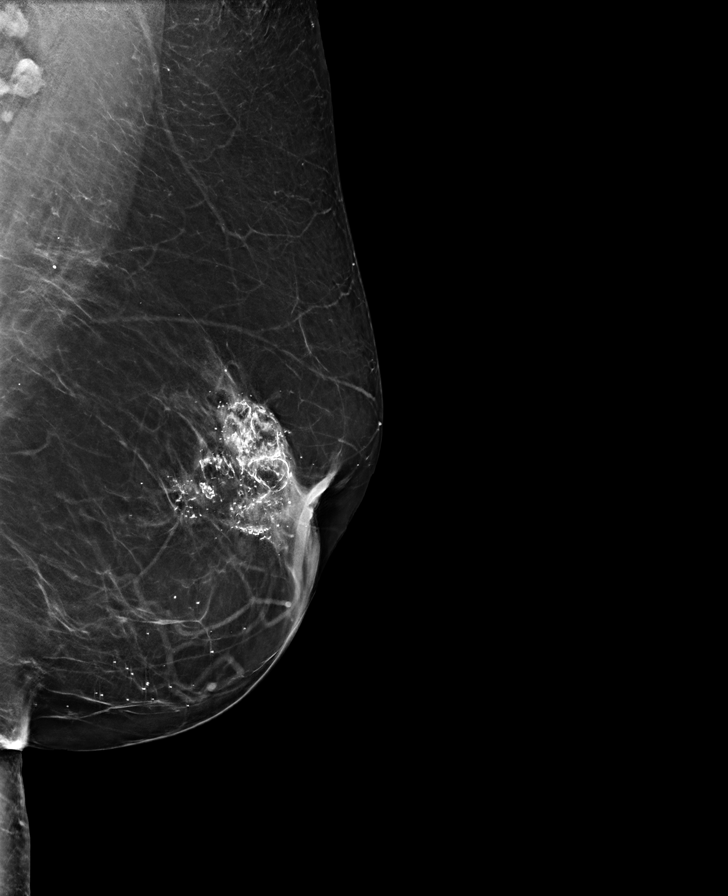

[L CC]
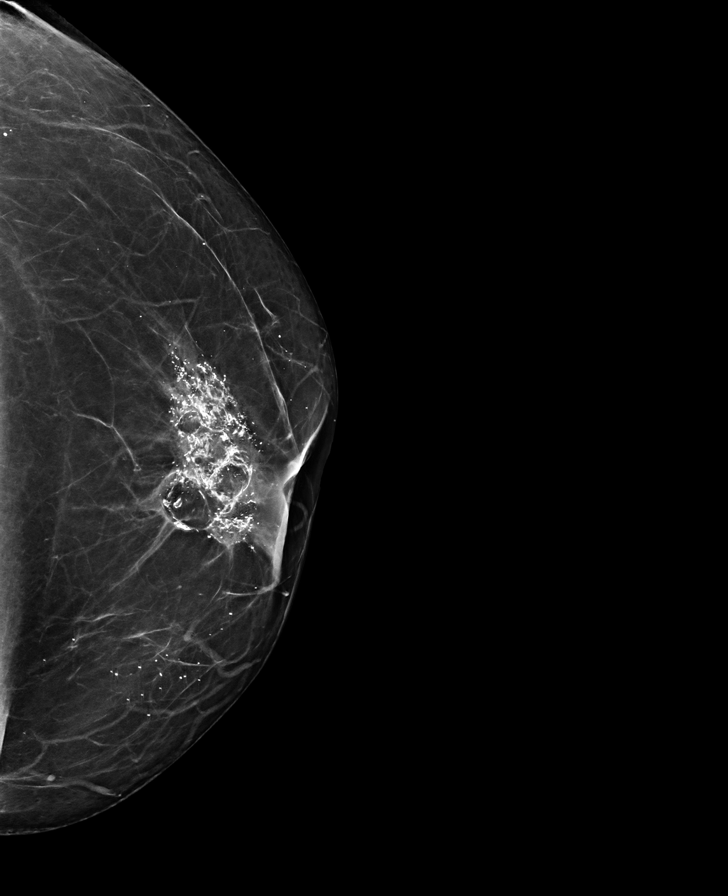

[R CC tomo · tomo slice 13/26.0]
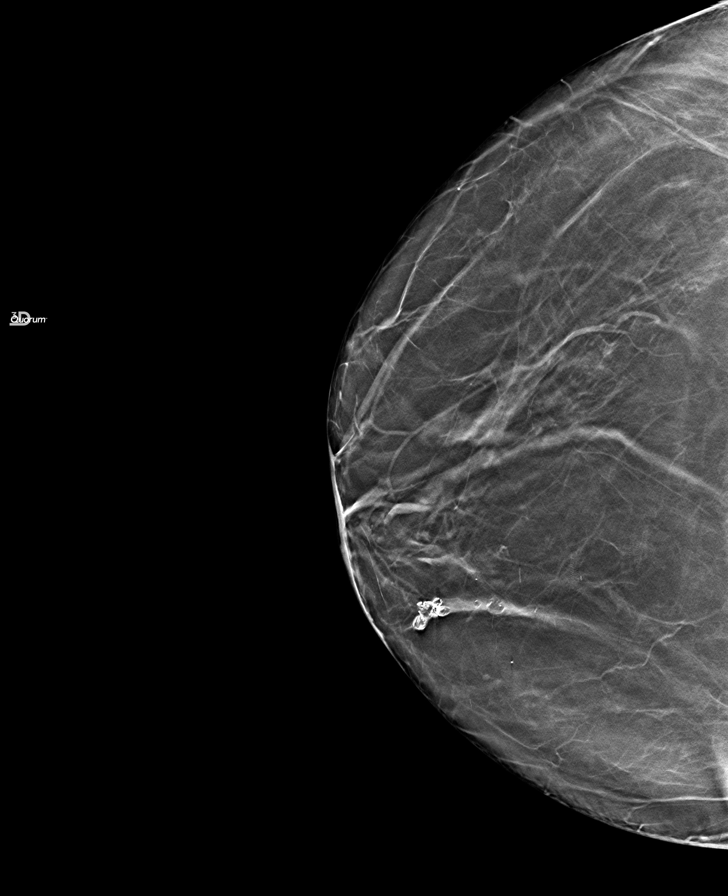

[L MLO tomo · tomo slice 15/28.0]
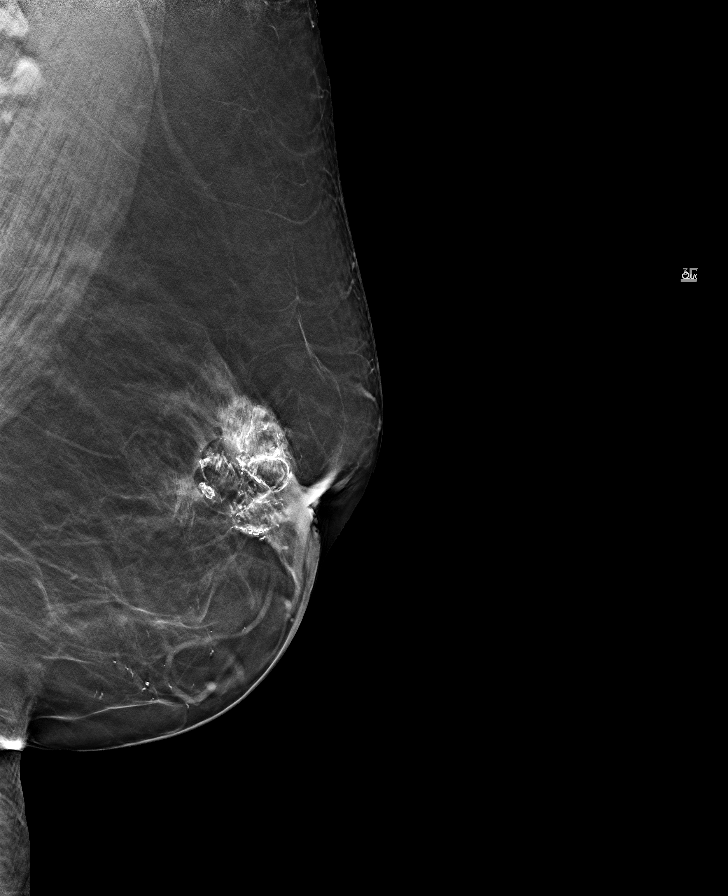

[R MLO tomo · tomo slice 15/30.0]
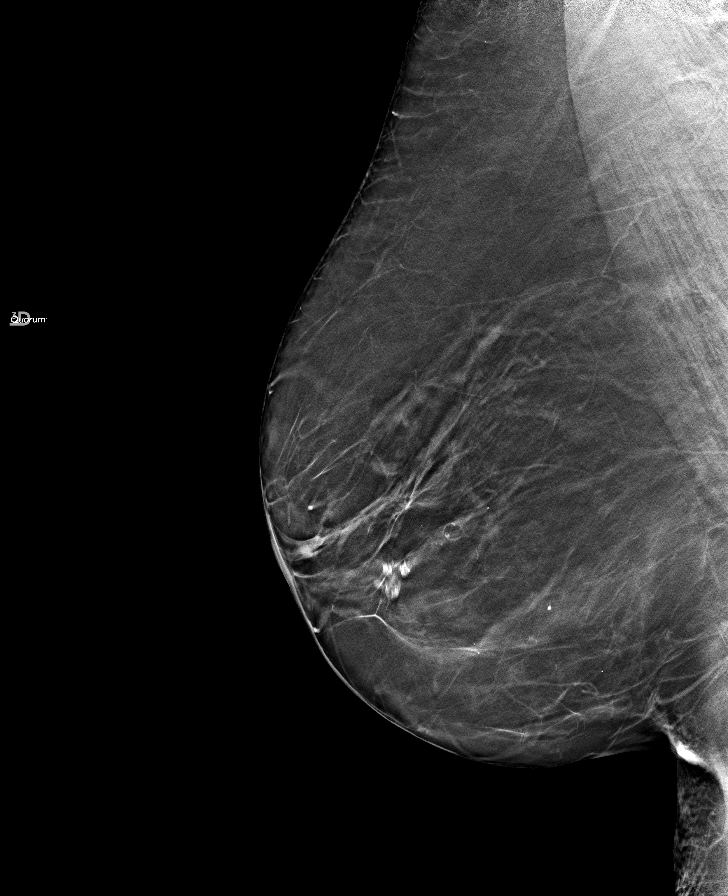

[L CC tomo · tomo slice 14/27.0]
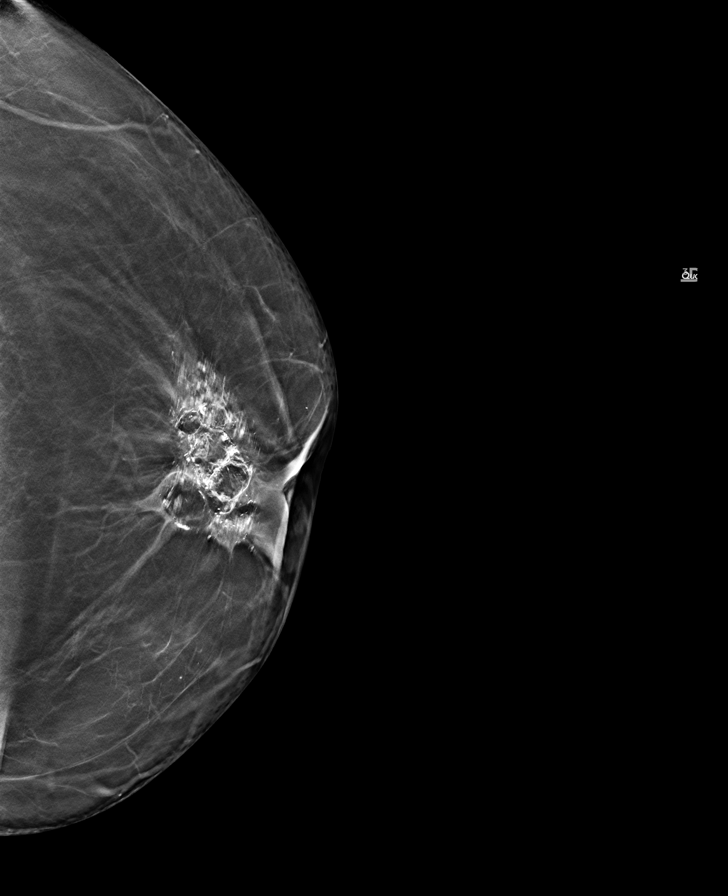

[8 of 24 positions shown; findings below may reference images not displayed]

FINDINGS: Posttreatment changes seen on the left. Dystrophic calcifications seen 
bilaterally. No new suspicious abnormality seen.
IMPRESSION: Stable exam. 
(BI-RADS 2) Benign findings. Routine mammographic follow-up is recommended.

## 2023-04-22 IMAGING — DX CHEST PA AND LATERAL
2 series · 2 of 2 positions shown · non-contrast
Comparison: CT scan of the chest from May 2021.

________________________________________________________________________________________________ 
CLINICAL INDICATION: Other specified cough.

[PA]
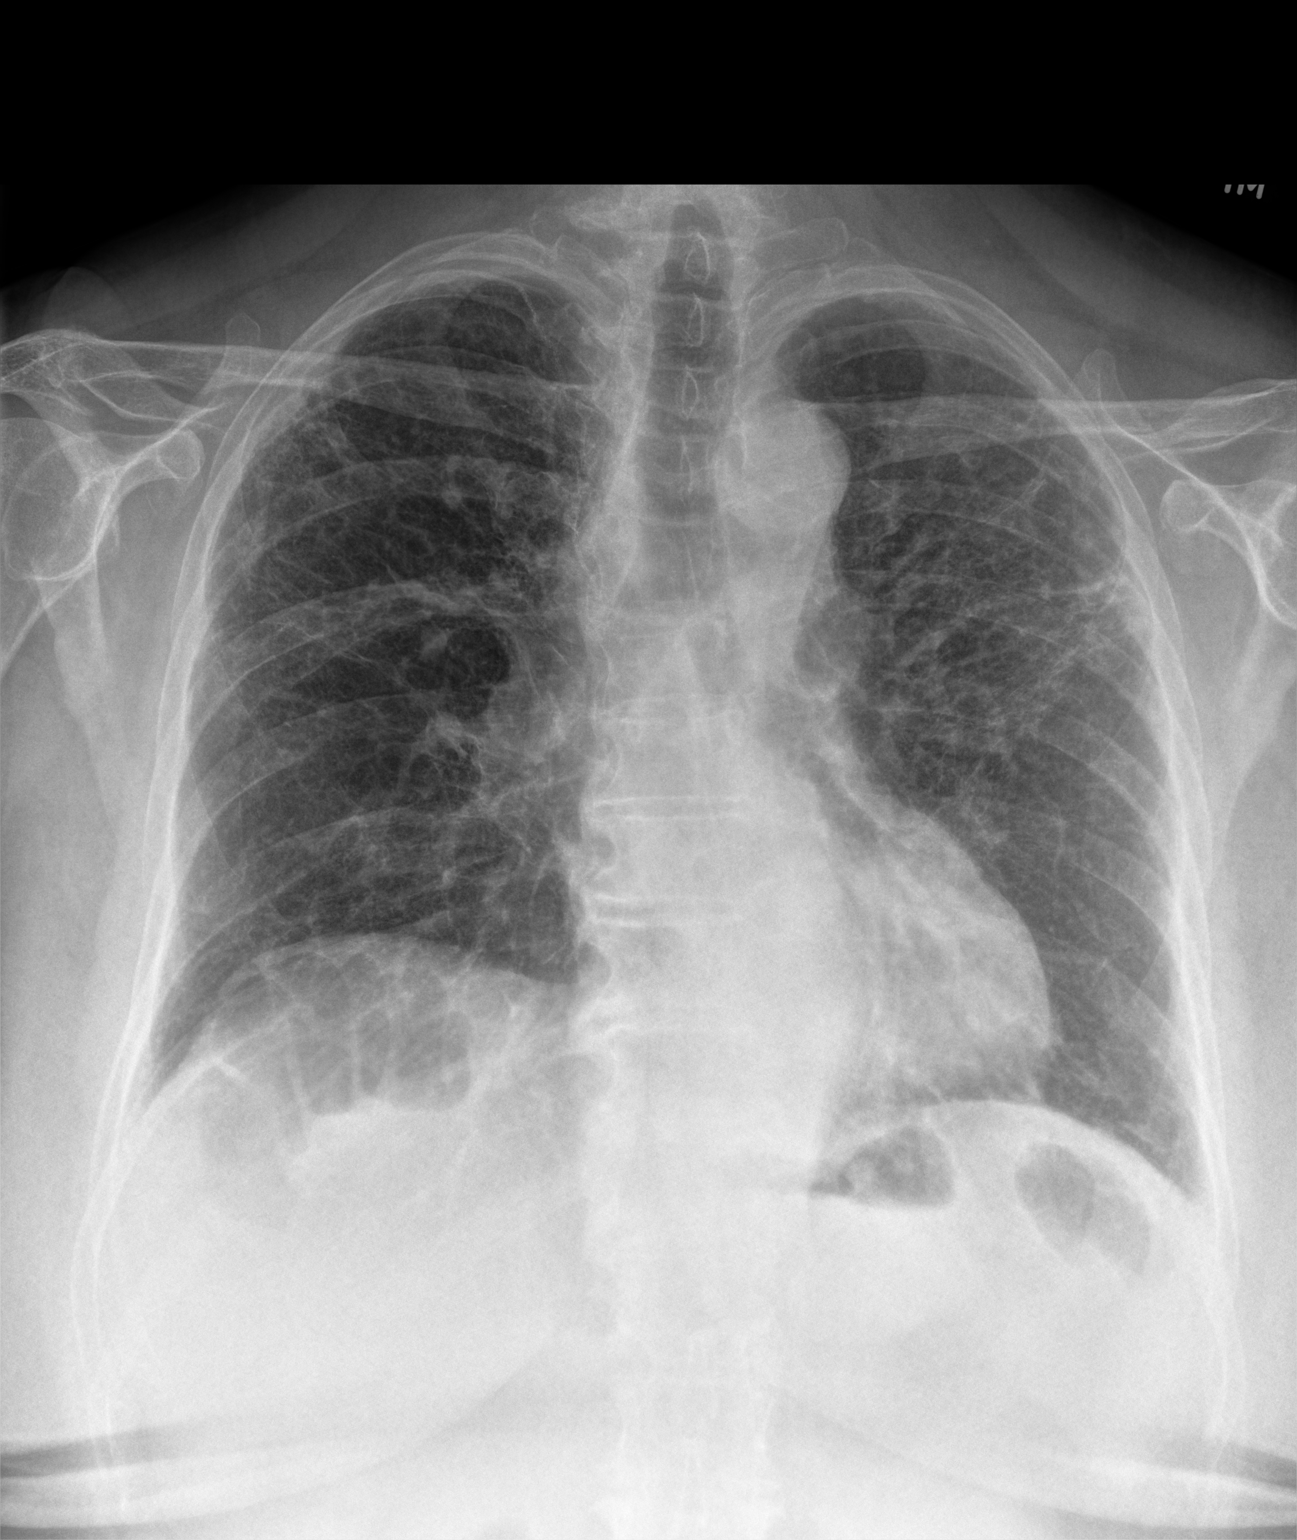

[lateral]
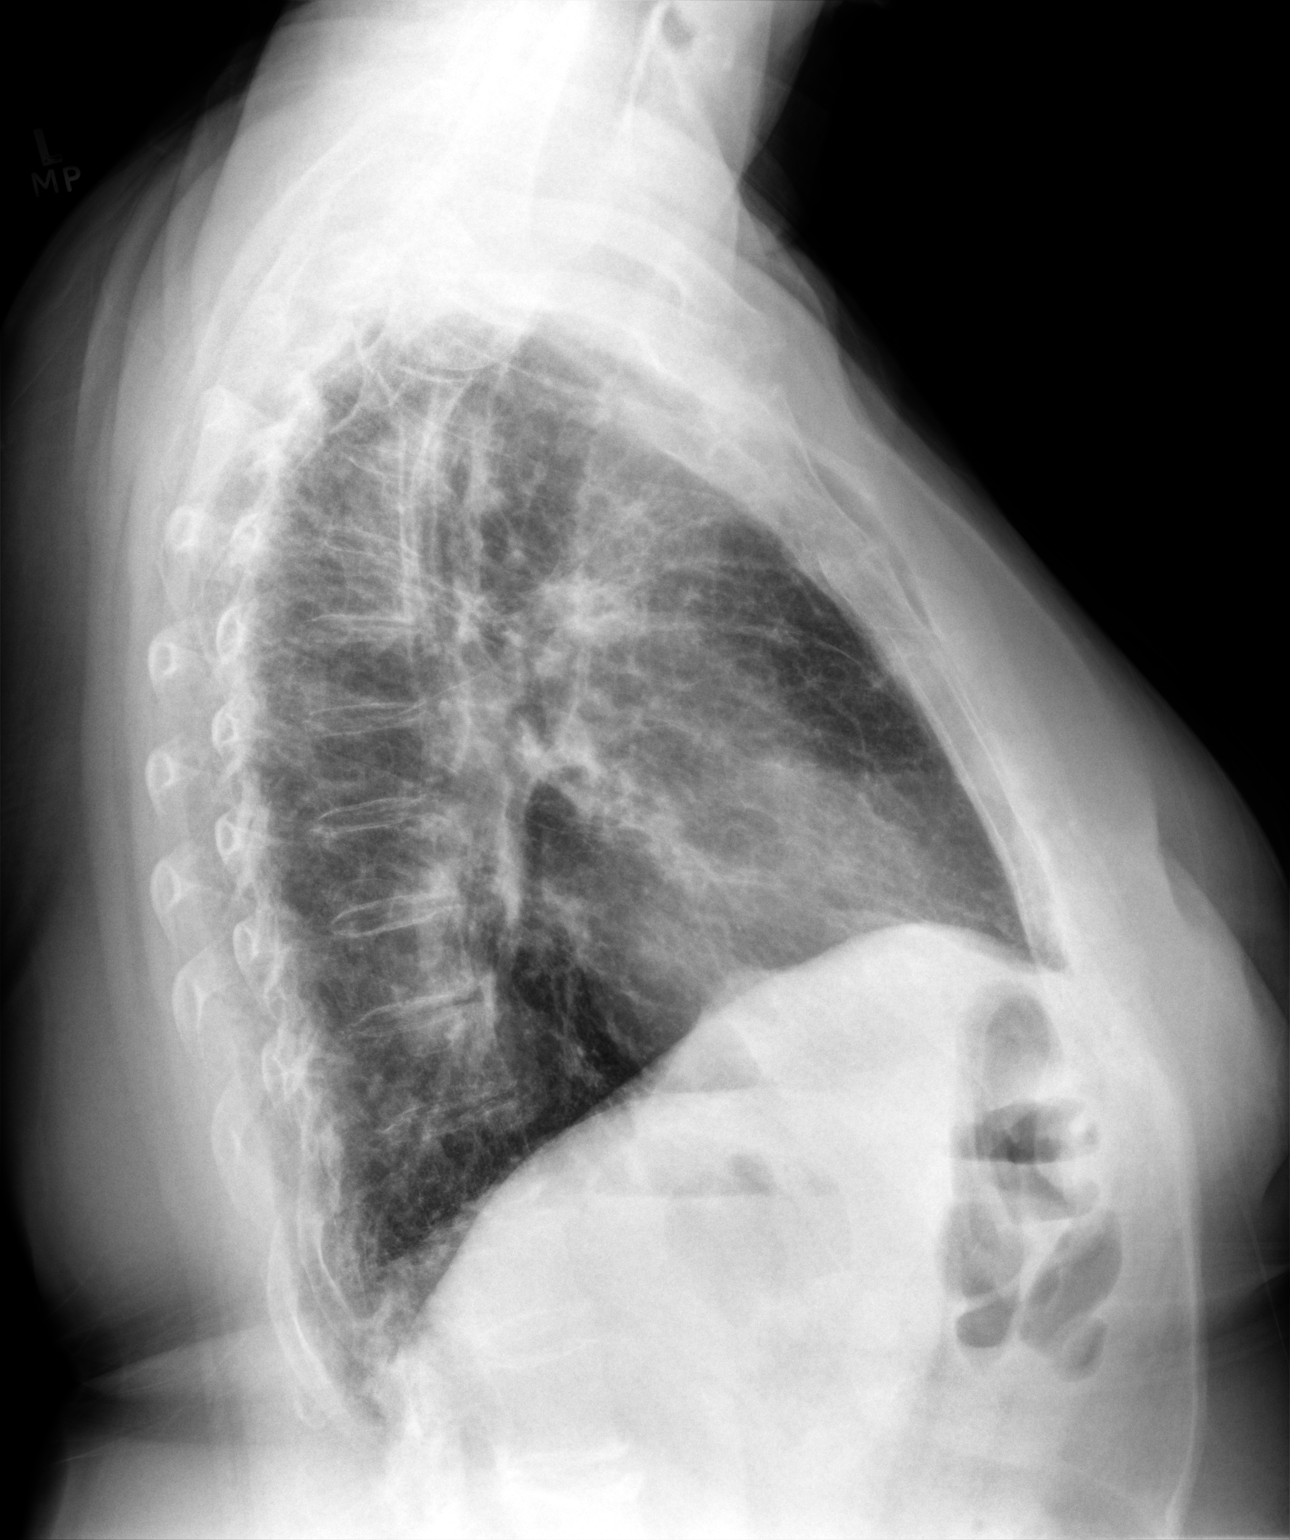

[2 of 2 positions shown; findings below may reference images not displayed]

FINDINGS: Coarse increased interstitial markings are seen in the subpleural 
location bilaterally. Thought to represent secondary changes of emphysema and 
fibrosis. This is similar distribution to the exam from [DATE] be mildly 
progressed. Atherosclerotic changes and degenerative changes. No pleural 
effusion seen.
IMPRESSION: Suspect progressive changes of fibrosis in this patient with underlying 
emphysema. Follow-up CT scan of the chest without IV contrast directly to 
compare to the exam from [DATE] be beneficial.
